# Patient Record
Sex: Male | Born: 1970 | Race: White | Hispanic: No | Marital: Married | Smoking: Never smoker
Health system: Southern US, Community
[De-identification: ages and names within clinical notes are randomized; demographics above are authoritative.]

## PROBLEM LIST (undated history)

## (undated) DIAGNOSIS — C801 Malignant (primary) neoplasm, unspecified: Secondary | ICD-10-CM

## (undated) DIAGNOSIS — I10 Essential (primary) hypertension: Secondary | ICD-10-CM

## (undated) HISTORY — DX: Essential (primary) hypertension: I10

## (undated) HISTORY — DX: Malignant (primary) neoplasm, unspecified: C80.1

---

## 2006-01-26 ENCOUNTER — Encounter: Admission: RE | Admit: 2006-01-26 | Discharge: 2006-01-26 | Payer: Self-pay | Admitting: Family Medicine

## 2006-11-16 ENCOUNTER — Ambulatory Visit: Payer: Self-pay | Admitting: Family Medicine

## 2006-12-11 ENCOUNTER — Ambulatory Visit: Payer: Self-pay | Admitting: Pulmonary Disease

## 2006-12-14 ENCOUNTER — Ambulatory Visit (HOSPITAL_BASED_OUTPATIENT_CLINIC_OR_DEPARTMENT_OTHER): Admission: RE | Admit: 2006-12-14 | Discharge: 2006-12-14 | Payer: Self-pay | Admitting: Pulmonary Disease

## 2006-12-22 ENCOUNTER — Ambulatory Visit: Payer: Self-pay | Admitting: Pulmonary Disease

## 2007-02-05 ENCOUNTER — Ambulatory Visit: Payer: Self-pay | Admitting: Pulmonary Disease

## 2007-03-08 ENCOUNTER — Ambulatory Visit: Payer: Self-pay | Admitting: Pulmonary Disease

## 2011-01-28 ENCOUNTER — Emergency Department (HOSPITAL_COMMUNITY)
Admission: EM | Admit: 2011-01-28 | Discharge: 2011-01-28 | Disposition: A | Discharge status: Other Acute Inpatient Hospital | Payer: 59 | Attending: Emergency Medicine | Admitting: Emergency Medicine

## 2011-01-28 ENCOUNTER — Emergency Department (INDEPENDENT_AMBULATORY_CARE_PROVIDER_SITE_OTHER): Payer: 59

## 2011-01-28 ENCOUNTER — Emergency Department (HOSPITAL_BASED_OUTPATIENT_CLINIC_OR_DEPARTMENT_OTHER)
Admission: EM | Admit: 2011-01-28 | Discharge: 2011-01-28 | Disposition: A | Discharge status: Other Acute Inpatient Hospital | Payer: 59 | Source: Home / Self Care | Attending: Emergency Medicine | Admitting: Emergency Medicine

## 2011-01-28 DIAGNOSIS — R079 Chest pain, unspecified: Secondary | ICD-10-CM | POA: Insufficient documentation

## 2011-01-28 DIAGNOSIS — R0602 Shortness of breath: Secondary | ICD-10-CM

## 2011-01-28 DIAGNOSIS — R110 Nausea: Secondary | ICD-10-CM | POA: Insufficient documentation

## 2011-01-28 DIAGNOSIS — R197 Diarrhea, unspecified: Secondary | ICD-10-CM | POA: Insufficient documentation

## 2011-01-28 DIAGNOSIS — R0789 Other chest pain: Secondary | ICD-10-CM

## 2011-01-28 DIAGNOSIS — R072 Precordial pain: Secondary | ICD-10-CM | POA: Insufficient documentation

## 2011-01-28 LAB — COMPREHENSIVE METABOLIC PANEL
ALT: 33 U/L (ref 0–53)
BUN: 12 mg/dL (ref 6–23)
Calcium: 9.7 mg/dL (ref 8.4–10.5)
Chloride: 106 mEq/L (ref 96–112)
Glucose, Bld: 101 mg/dL — ABNORMAL HIGH (ref 70–99)
Total Protein: 8.1 g/dL (ref 6.0–8.3)

## 2011-01-28 LAB — POCT CARDIAC MARKERS
CKMB, poc: <1 ng/mL — ABNORMAL LOW (ref 1.0–8.0)
CKMB, poc: <1 ng/mL — ABNORMAL LOW (ref 1.0–8.0)
Myoglobin, poc: 59.8 ng/mL (ref 12–200)
Troponin i, poc: <0.05 ng/mL (ref 0.00–0.09)
Troponin i, poc: <0.05 ng/mL (ref 0.00–0.09)

## 2011-01-28 LAB — CBC
Hemoglobin: 14.3 g/dL (ref 13.0–17.0)
MCH: 30.9 pg (ref 26.0–34.0)
Platelets: 204 10*3/uL (ref 150–400)
RBC: 4.63 MIL/uL (ref 4.22–5.81)
WBC: 6.2 10*3/uL (ref 4.0–10.5)

## 2011-01-28 LAB — DIFFERENTIAL
Basophils Relative: 1 % (ref 0–1)
Eosinophils Relative: 2 % (ref 0–5)
Lymphs Abs: 1.6 10*3/uL (ref 0.7–4.0)
Monocytes Absolute: 0.6 10*3/uL (ref 0.1–1.0)
Monocytes Relative: 10 % (ref 3–12)
Neutro Abs: 3.9 10*3/uL (ref 1.7–7.7)

## 2011-01-28 LAB — BASIC METABOLIC PANEL
BUN: 12 mg/dL (ref 6–23)
Creatinine, Ser: 1.2 mg/dL (ref 0.4–1.5)
GFR calc Af Amer: >60 mL/min (ref >60)
GFR calc non Af Amer: >60 mL/min (ref >60)

## 2011-01-29 DIAGNOSIS — R072 Precordial pain: Secondary | ICD-10-CM

## 2011-01-29 LAB — POCT CARDIAC MARKERS
CKMB, poc: <1 ng/mL — ABNORMAL LOW (ref 1.0–8.0)
Myoglobin, poc: 80.6 ng/mL (ref 12–200)
Myoglobin, poc: 80.1 ng/mL (ref 12–200)
Troponin i, poc: <0.05 ng/mL (ref 0.00–0.09)

## 2011-09-26 IMAGING — CR DG CHEST 2V
2 series · 2 of 2 positions shown · non-contrast
Comparison: None

CLINICAL DATA: Chest pressure.  Shortness of breath and nausea.

CHEST - 2 VIEW

[w chest pa]
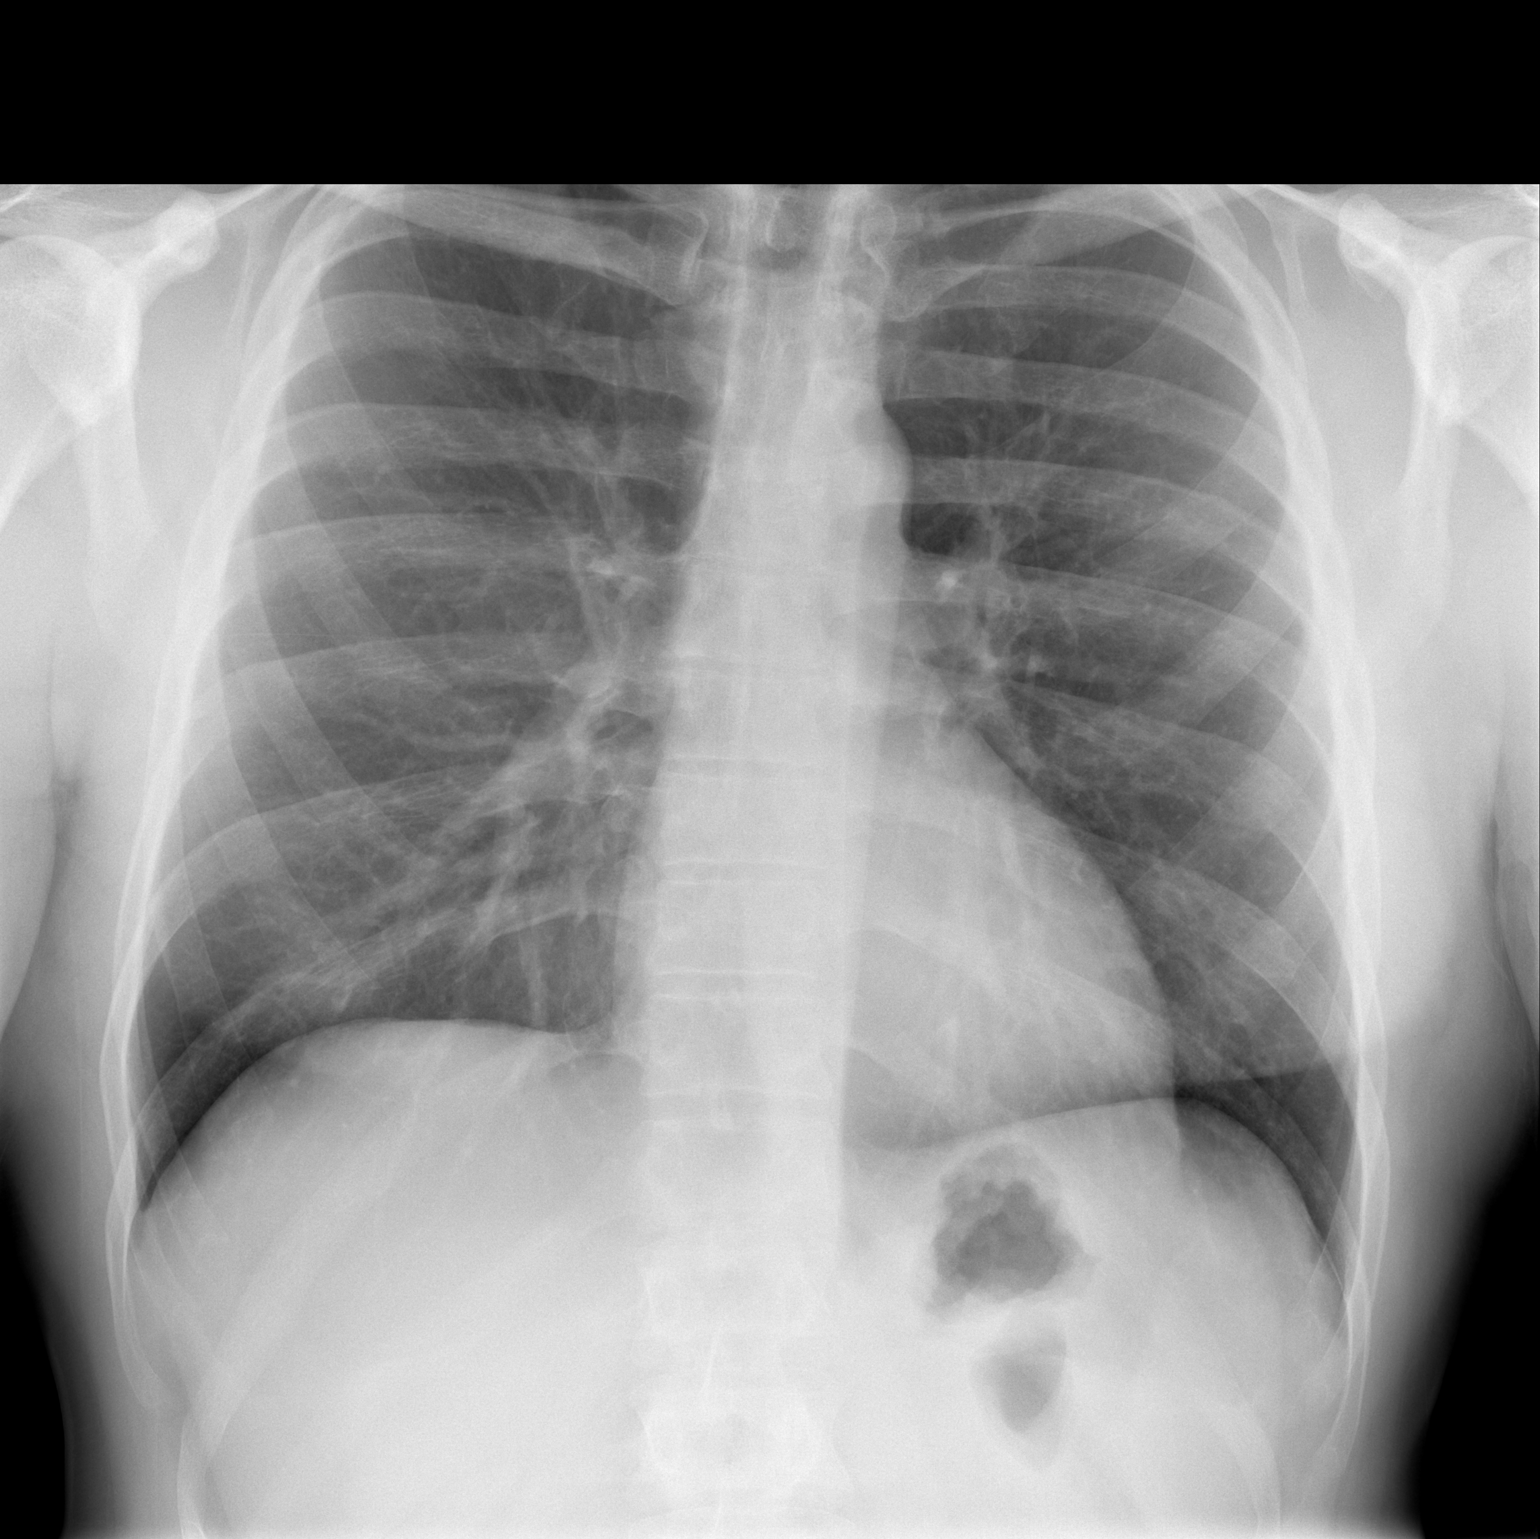

[w chest lat]
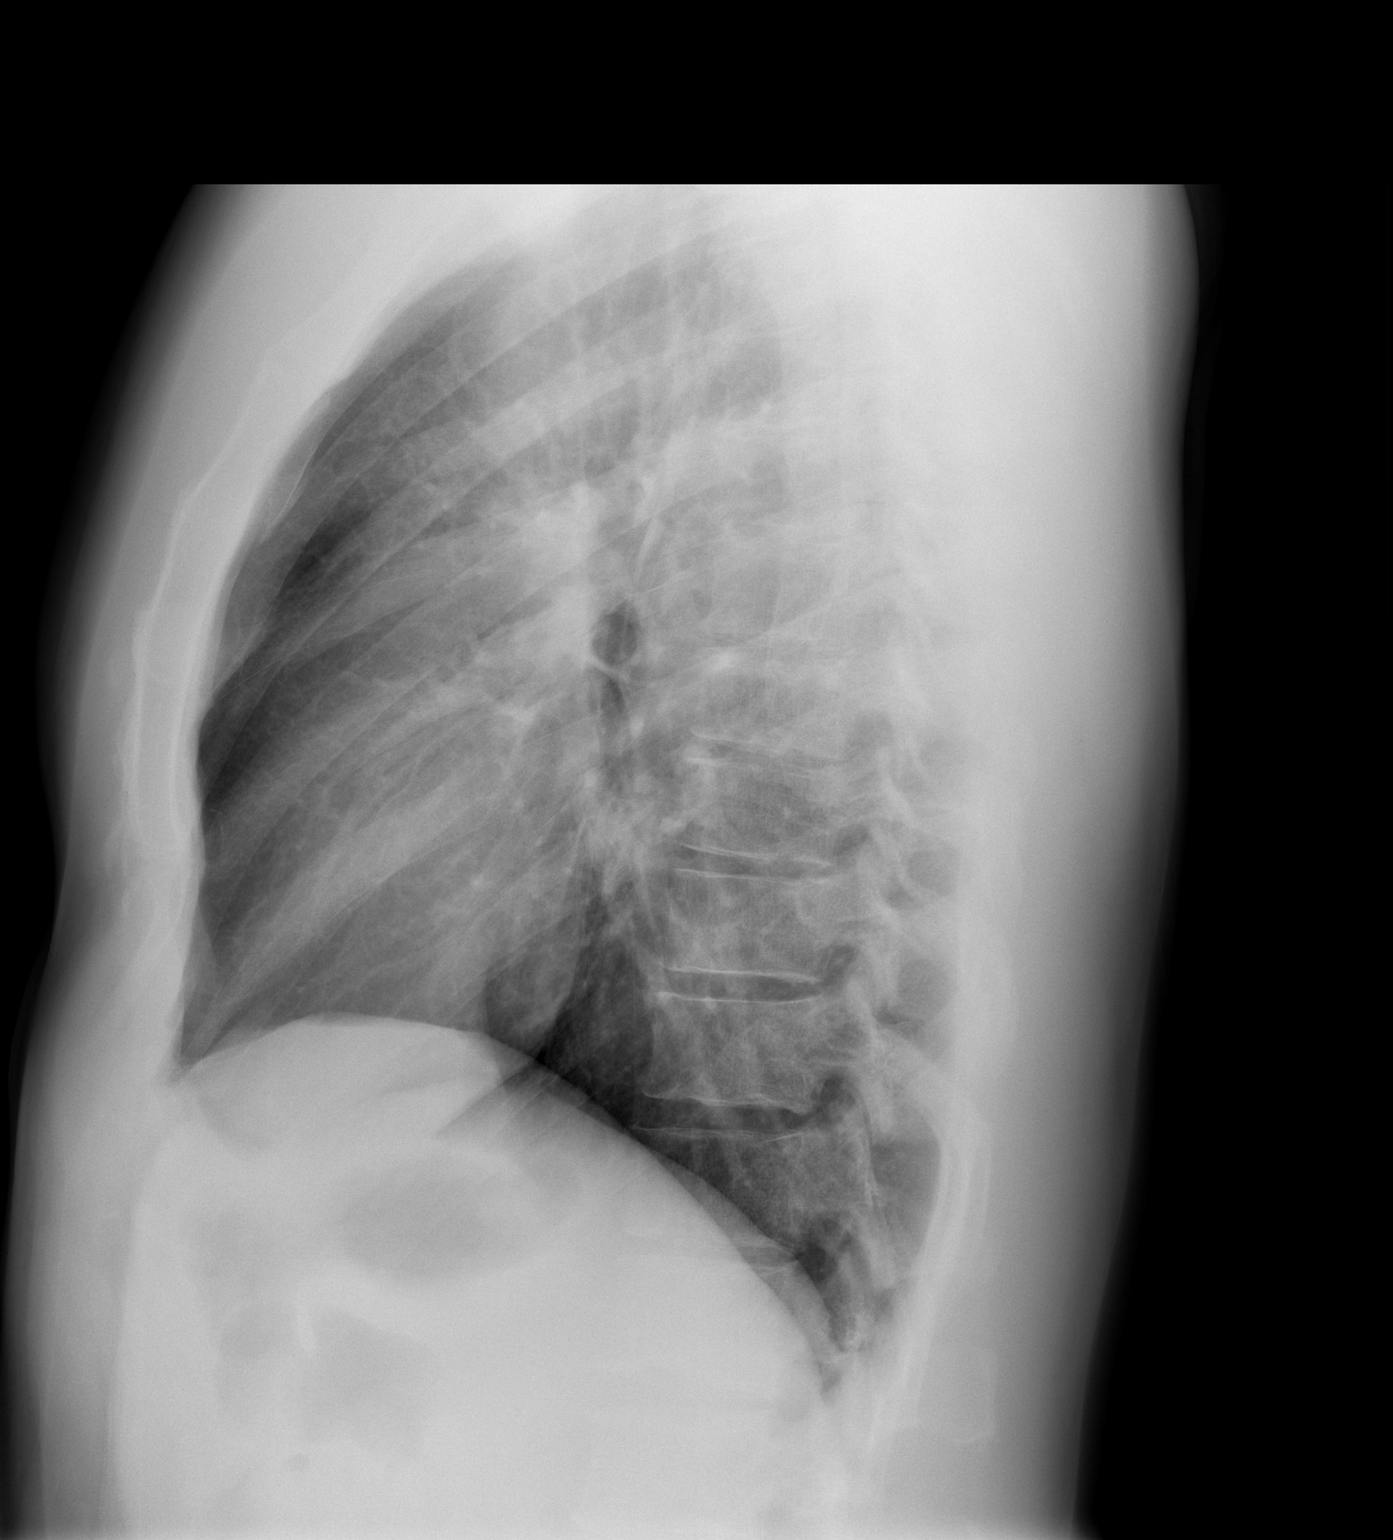

[2 of 2 positions shown; findings below may reference images not displayed]

FINDINGS: The heart size and mediastinal contours are within normal
limits.  Both lungs are clear.  The visualized skeletal structures
are unremarkable.
IMPRESSION: No active cardiopulmonary abnormalities.

## 2018-12-16 DIAGNOSIS — I10 Essential (primary) hypertension: Secondary | ICD-10-CM | POA: Diagnosis not present

## 2018-12-19 DIAGNOSIS — R531 Weakness: Secondary | ICD-10-CM | POA: Diagnosis not present

## 2018-12-19 DIAGNOSIS — I10 Essential (primary) hypertension: Secondary | ICD-10-CM | POA: Diagnosis not present

## 2018-12-19 DIAGNOSIS — R51 Headache: Secondary | ICD-10-CM | POA: Diagnosis not present

## 2018-12-19 DIAGNOSIS — R079 Chest pain, unspecified: Secondary | ICD-10-CM | POA: Diagnosis not present

## 2018-12-19 DIAGNOSIS — Z79899 Other long term (current) drug therapy: Secondary | ICD-10-CM | POA: Diagnosis not present

## 2018-12-20 ENCOUNTER — Encounter: Payer: Self-pay | Admitting: Family Medicine

## 2018-12-20 ENCOUNTER — Ambulatory Visit: Payer: 59 | Admitting: Family Medicine

## 2018-12-20 VITALS — BP 152/78 | HR 68 | Temp 98.2°F | Ht 76.0 in | Wt 229.0 lb

## 2018-12-20 DIAGNOSIS — Z23 Encounter for immunization: Secondary | ICD-10-CM

## 2018-12-20 DIAGNOSIS — I10 Essential (primary) hypertension: Secondary | ICD-10-CM | POA: Diagnosis not present

## 2018-12-20 MED ORDER — OLMESARTAN MEDOXOMIL 20 MG PO TABS: 20.0000 mg | ORAL_TABLET | Freq: Every day | ORAL | 1 refills | Status: DC

## 2018-12-20 MED ORDER — GENERIC EXTERNAL MEDICATION
10.00 | Status: DC
Start: ? — End: 2018-12-20

## 2018-12-20 MED ORDER — SODIUM CHLORIDE 0.9 % IV SOLN
10.00 | INTRAVENOUS | Status: DC
Start: ? — End: 2018-12-20

## 2018-12-20 NOTE — Patient Instructions (Signed)
Start olmesartan in place of losartan F/u with me in 4-6 weeks.     DASH Eating Plan DASH stands for "Dietary Approaches to Stop Hypertension." The DASH eating plan is a healthy eating plan that has been shown to reduce high blood pressure (hypertension). It may also reduce your risk for type 2 diabetes, heart disease, and stroke. The DASH eating plan may also help with weight loss. What are tips for following this plan?  General guidelines  Avoid eating more than 2,300 mg (milligrams) of salt (sodium) a day. If you have hypertension, you may need to reduce your sodium intake to 1,500 mg a day.  Limit alcohol intake to no more than 1 drink a day for nonpregnant women and 2 drinks a day for men. One drink equals 12 oz of beer, 5 oz of wine, or 1 oz of hard liquor.  Work with your health care provider to maintain a healthy body weight or to lose weight. Ask what an ideal weight is for you.  Get at least 30 minutes of exercise that causes your heart to beat faster (aerobic exercise) most days of the week. Activities may include walking, swimming, or biking.  Work with your health care provider or diet and nutrition specialist (dietitian) to adjust your eating plan to your individual calorie needs. Reading food labels   Check food labels for the amount of sodium per serving. Choose foods with less than 5 percent of the Daily Value of sodium. Generally, foods with less than 300 mg of sodium per serving fit into this eating plan.  To find whole grains, look for the word "whole" as the first word in the ingredient list. Shopping  Buy products labeled as "low-sodium" or "no salt added."  Buy fresh foods. Avoid canned foods and premade or frozen meals. Cooking  Avoid adding salt when cooking. Use salt-free seasonings or herbs instead of table salt or sea salt. Check with your health care provider or pharmacist before using salt substitutes.  Do not fry foods. Cook foods using healthy  methods such as baking, boiling, grilling, and broiling instead.  Cook with heart-healthy oils, such as olive, canola, soybean, or sunflower oil. Meal planning  Eat a balanced diet that includes: ? 5 or more servings of fruits and vegetables each day. At each meal, try to fill half of your plate with fruits and vegetables. ? Up to 6-8 servings of whole grains each day. ? Less than 6 oz of lean meat, poultry, or fish each day. A 3-oz serving of meat is about the same size as a deck of cards. One egg equals 1 oz. ? 2 servings of low-fat dairy each day. ? A serving of nuts, seeds, or beans 5 times each week. ? Heart-healthy fats. Healthy fats called Omega-3 fatty acids are found in foods such as flaxseeds and coldwater fish, like sardines, salmon, and mackerel.  Limit how much you eat of the following: ? Canned or prepackaged foods. ? Food that is high in trans fat, such as fried foods. ? Food that is high in saturated fat, such as fatty meat. ? Sweets, desserts, sugary drinks, and other foods with added sugar. ? Full-fat dairy products.  Do not salt foods before eating.  Try to eat at least 2 vegetarian meals each week.  Eat more home-cooked food and less restaurant, buffet, and fast food.  When eating at a restaurant, ask that your food be prepared with less salt or no salt, if possible. What foods  are recommended? The items listed may not be a complete list. Talk with your dietitian about what dietary choices are best for you. Grains Whole-grain or whole-wheat bread. Whole-grain or whole-wheat pasta. Brown rice. [place]. Bulgur. Whole-grain and low-sodium cereals. Pita bread. Low-fat, low-sodium crackers. Whole-wheat flour tortillas. Vegetables Fresh or frozen vegetables (raw, steamed, roasted, or grilled). Low-sodium or reduced-sodium tomato and vegetable juice. Low-sodium or reduced-sodium tomato sauce and tomato paste. Low-sodium or reduced-sodium canned  vegetables. Fruits All fresh, dried, or frozen fruit. Canned fruit in natural juice (without added sugar). Meat and other protein foods Skinless chicken or [place]. Ground chicken or [place]. Pork with fat trimmed off. Fish and seafood. Egg whites. Dried beans, peas, or lentils. Unsalted nuts, nut butters, and seeds. Unsalted canned beans. Lean cuts of beef with fat trimmed off. Low-sodium, lean deli meat. Dairy Low-fat (1%) or fat-free (skim) milk. Fat-free, low-fat, or reduced-fat cheeses. Nonfat, low-sodium ricotta or cottage cheese. Low-fat or nonfat yogurt. Low-fat, low-sodium cheese. Fats and oils Soft margarine without trans fats. Vegetable oil. Low-fat, reduced-fat, or light mayonnaise and salad dressings (reduced-sodium). Canola, safflower, olive, soybean, and sunflower oils. Avocado. Seasoning and other foods Herbs. Spices. Seasoning mixes without salt. Unsalted popcorn and pretzels. Fat-free sweets. What foods are not recommended? The items listed may not be a complete list. Talk with your dietitian about what dietary choices are best for you. Grains Baked goods made with fat, such as croissants, muffins, or some breads. Dry pasta or rice meal packs. Vegetables Creamed or fried vegetables. Vegetables in a cheese sauce. Regular canned vegetables (not low-sodium or reduced-sodium). Regular canned tomato sauce and paste (not low-sodium or reduced-sodium). Regular tomato and vegetable juice (not low-sodium or reduced-sodium). Angie Fava. Olives. Fruits Canned fruit in a light or heavy syrup. Fried fruit. Fruit in cream or butter sauce. Meat and other protein foods Fatty cuts of meat. Ribs. Fried meat. Berniece Salines. Sausage. Bologna and other processed lunch meats. Salami. Fatback. Hotdogs. Bratwurst. Salted nuts and seeds. Canned beans with added salt. Canned or smoked fish. Whole eggs or egg yolks. Chicken or [place] with skin. Dairy Whole or 2% milk, cream, and half-and-half. Whole or full-fat  cream cheese. Whole-fat or sweetened yogurt. Full-fat cheese. Nondairy creamers. Whipped toppings. Processed cheese and cheese spreads. Fats and oils Butter. Stick margarine. Lard. Shortening. Ghee. Bacon fat. Tropical oils, such as coconut, palm kernel, or palm oil. Seasoning and other foods Salted popcorn and pretzels. Onion salt, garlic salt, seasoned salt, table salt, and sea salt. Worcestershire sauce. Tartar sauce. Barbecue sauce. Teriyaki sauce. Soy sauce, including reduced-sodium. Steak sauce. Canned and packaged gravies. Fish sauce. Oyster sauce. Cocktail sauce. Horseradish that you find on the shelf. Ketchup. Mustard. Meat flavorings and tenderizers. Bouillon cubes. Hot sauce and Tabasco sauce. Premade or packaged marinades. Premade or packaged taco seasonings. Relishes. Regular salad dressings. Where to find more information:  National Heart, Lung, and [place]: https://wilson-eaton.com/  American Heart Association: www.heart.org Summary  The DASH eating plan is a healthy eating plan that has been shown to reduce high blood pressure (hypertension). It may also reduce your risk for type 2 diabetes, heart disease, and stroke.  With the DASH eating plan, you should limit salt (sodium) intake to 2,300 mg a day. If you have hypertension, you may need to reduce your sodium intake to 1,500 mg a day.  When on the DASH eating plan, aim to eat more fresh fruits and vegetables, whole grains, lean proteins, low-fat dairy, and heart-healthy fats.  Work with your  health care provider or diet and nutrition specialist (dietitian) to adjust your eating plan to your individual calorie needs. This information is not intended to replace advice given to you by your health care provider. Make sure you discuss any questions you have with your health care provider. Document Released: 11/13/2011 Document Revised: 11/17/2016 Document Reviewed: 11/17/2016 Elsevier Interactive Patient Education  2019 [employer].

## 2018-12-20 NOTE — Progress Notes (Signed)
David Avila - 48 y.o. male MRN [id number]  Date of birth: 12/23/1970  Subjective Chief Complaint  Patient presents with  . Hypertension    HPI David Avila is a 48 y.o. male here today to establish care with new pcp.  He was recently diagnosed with HTN after reporting not feeling well and having frequent headache.  He was started on losaratan.  He continued to not feel well into this weekend and was seen at a local ER.  Given hydralazine and metoprolol with improvement of BP and symptoms.  He has continued on losartan since that time.  His bp remains elevated today but he reports that he is feeling ok.  He feels that he follows a healthy diet and exercises fairly regularly.  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  Currently he denies chest pain,. shortness of breath, palpitations, headache or vision changes.   ROS:  A comprehensive ROS was completed and negative except as noted per HPI  No Known Allergies  Past Medical History:  Diagnosis Date  . Cancer ([place])    melanoma -insitu-left foot    History reviewed. No pertinent surgical history.  Social History   Socioeconomic History  . Marital status: Single    Spouse name: Not on file  . Number of children: Not on file  . Years of education: Not on file  . Highest education level: Not on file  Occupational History  . Not on file  Social Needs  . Financial resource strain: Not on file  . Food insecurity:    Worry: Not on file    Inability: Not on file  . Transportation needs:    Medical: Not on file    Non-medical: Not on file  Tobacco Use  . Smoking status: Never Smoker  . Smokeless tobacco: Never Used  Substance and Sexual Activity  . Alcohol use: Yes  . Drug use: Not on file  . Sexual activity: Not on file  Lifestyle  . Physical activity:    Days per week: Not on file    Minutes per session: Not on file  . Stress: Not on file  Relationships  . Social connections:   Talks on phone: Not on file    Gets together: Not on file    Attends religious service: Not on file    Active member of club or organization: Not on file    Attends meetings of clubs or organizations: Not on file    Relationship status: Not on file  Other Topics Concern  . Not on file  Social History Narrative  . Not on file    Family History  Problem Relation Age of Onset  . COPD Mother   . Diabetes Mother   . Early death Mother   . Hyperlipidemia Mother   . Hypertension Mother   . Stroke Mother   . Alcohol abuse Father   . Cancer Father   . Early death Father   . COPD Maternal Grandmother   . Early death Maternal Grandmother   . Early death Maternal Grandfather   . Hyperlipidemia Maternal Grandfather   . Cancer Paternal Grandmother   . Heart disease Paternal Grandfather     Health Maintenance  Topic Date Due  . HIV Screening  08/03/1984  . TETANUS/TDAP  08/03/1988  . INFLUENZA VACCINE  07/08/2018    ----------------------------------------------------------------------------------------------------------------------------------------------------------------------------------------------------------------- Physical Exam BP (!) 152/78   Pulse 68   Temp 98.2 F (36.8 C) (Oral)  Ht 6\' 4"  (1.93 m)   Wt 229 lb (103.9 kg)   SpO2 98%   BMI 27.87 kg/m   Physical Exam Constitutional:      Appearance: Normal appearance.  HENT:     Head: Normocephalic.     Mouth/Throat:     Mouth: Mucous membranes are moist.  Eyes:     General: No scleral icterus. Neck:     Musculoskeletal: Neck supple.  Cardiovascular:     Rate and Rhythm: Normal rate and regular rhythm.  Pulmonary:     Effort: Pulmonary effort is normal.     Breath sounds: Normal breath sounds.  Musculoskeletal:     Right lower leg: No edema.     Left lower leg: No edema.  Lymphadenopathy:     Cervical: No cervical adenopathy.  Skin:    General: Skin is warm and dry.     Findings: No rash.    Neurological:     General: No focal deficit present.     Mental Status: He is alert.  Psychiatric:        Mood and Affect: Mood normal.        Behavior: Behavior normal.     ------------------------------------------------------------------------------------------------------------------------------------------------------------------------------------------------------------------- Assessment and Plan  Essential hypertension -BP remains elevated, asymptomatic at this time.  -Change losartan to olmesartan for longer half life.  -Lab work from ED reviewed via [employer].  -Given info on DASH diet -F/u in 4-6 weeks.

## 2018-12-20 NOTE — Assessment & Plan Note (Signed)
-  BP remains elevated, asymptomatic at this time.  -Change losartan to olmesartan for longer half life.  -Lab work from ED reviewed via [employer].  -Given info on DASH diet -F/u in 4-6 weeks.

## 2019-01-31 ENCOUNTER — Ambulatory Visit (INDEPENDENT_AMBULATORY_CARE_PROVIDER_SITE_OTHER): Payer: 59 | Admitting: Family Medicine

## 2019-01-31 ENCOUNTER — Encounter: Payer: Self-pay | Admitting: Family Medicine

## 2019-01-31 VITALS — BP 122/78 | HR 62 | Temp 98.2°F | Ht 76.0 in | Wt 232.0 lb

## 2019-01-31 DIAGNOSIS — Z0000 Encounter for general adult medical examination without abnormal findings: Secondary | ICD-10-CM

## 2019-01-31 DIAGNOSIS — Z13220 Encounter for screening for lipoid disorders: Secondary | ICD-10-CM

## 2019-01-31 DIAGNOSIS — I10 Essential (primary) hypertension: Secondary | ICD-10-CM

## 2019-01-31 DIAGNOSIS — Z125 Encounter for screening for malignant neoplasm of prostate: Secondary | ICD-10-CM | POA: Diagnosis not present

## 2019-01-31 LAB — COMPREHENSIVE METABOLIC PANEL
ALT: 14 U/L (ref 0–53)
AST: 19 U/L (ref 0–37)
Albumin: 4.7 g/dL (ref 3.5–5.2)
Alkaline Phosphatase: 63 U/L (ref 39–117)
BUN: 13 mg/dL (ref 6–23)
CALCIUM: 9.6 mg/dL (ref 8.4–10.5)
CHLORIDE: 98 meq/L (ref 96–112)
CO2: 31 meq/L (ref 19–32)
Creatinine, Ser: 1.14 mg/dL (ref 0.40–1.50)
GFR: 68.14 mL/min (ref >60.00)
GLUCOSE: 92 mg/dL (ref 70–99)
Potassium: 4.8 mEq/L (ref 3.5–5.1)
SODIUM: 135 meq/L (ref 135–145)
Total Bilirubin: 0.7 mg/dL (ref 0.2–1.2)
Total Protein: 7.2 g/dL (ref 6.0–8.3)

## 2019-01-31 LAB — LIPID PANEL
CHOL/HDL RATIO: 2
CHOLESTEROL: 162 mg/dL (ref 0–200)
HDL: 69.2 mg/dL (ref >39.00)
LDL CALC: 72 mg/dL (ref 0–99)
NonHDL: 92.82
Triglycerides: 104 mg/dL (ref 0.0–149.0)
VLDL: 20.8 mg/dL (ref 0.0–40.0)

## 2019-01-31 LAB — CBC
HEMATOCRIT: 40.1 % (ref 39.0–52.0)
Hemoglobin: 14.4 g/dL (ref 13.0–17.0)
MCHC: 35.9 g/dL (ref 30.0–36.0)
MCV: 89.1 fl (ref 78.0–100.0)
Platelets: 244 10*3/uL (ref 150.0–400.0)
RBC: 4.5 Mil/uL (ref 4.22–5.81)
RDW: 13.5 % (ref 11.5–15.5)
WBC: 6.2 10*3/uL (ref 4.0–10.5)

## 2019-01-31 LAB — PSA: PSA: 1.05 ng/mL (ref 0.10–4.00)

## 2019-01-31 NOTE — Assessment & Plan Note (Signed)
Well adult Orders Placed This Encounter  Procedures  . Comp Met (CMET)  . CBC  . Lipid Profile  . PSA  Screenings: Lipid, PSA.  Due for colon cancer screening next year.  Immunizations:  Up to date  Anticipatory guidance/Risk Factor reduction:  Per AVS

## 2019-01-31 NOTE — Patient Instructions (Signed)

## 2019-01-31 NOTE — Assessment & Plan Note (Signed)
-  BP well controlled, he'll continue olmesartan Return in about 6 months (around 08/01/2019) for HTN.

## 2019-01-31 NOTE — Progress Notes (Signed)
David Avila - 48 y.o. male MRN [id number]  Date of birth: 12/24/1970  Subjective Chief Complaint  Patient presents with  . Hypertension    doing well-denies changes     HPI David Avila is a 48 y.o. male with history of HTN here today for annual exam.  BP is much better controlled today with switch to olmesartan.  Reports he is doing well with current medication.  Denies side effects.  Has no additional concerns today.  He is a non-smoker with occasional EtOH use.  He exercises regularly and follows a fairly healthy diet.   Review of Systems  Constitutional: Negative for chills, fever, malaise/fatigue and weight loss.  HENT: Negative for congestion, ear pain and sore throat.   Eyes: Negative for blurred vision, double vision and pain.  Respiratory: Negative for cough and shortness of breath.   Cardiovascular: Negative for chest pain and palpitations.  Gastrointestinal: Negative for abdominal pain, blood in stool, constipation, heartburn and nausea.  Genitourinary: Negative for dysuria and urgency.  Musculoskeletal: Negative for joint pain and myalgias.  Neurological: Negative for dizziness and headaches.  Endo/Heme/Allergies: Does not bruise/bleed easily.  Psychiatric/Behavioral: Negative for depression. The patient is not nervous/anxious and does not have insomnia.      No Known Allergies  Past Medical History:  Diagnosis Date  . Cancer ([place])    melanoma -insitu-left foot    History reviewed. No pertinent surgical history.  Social History   Socioeconomic History  . Marital status: Single    Spouse name: Not on file  . Number of children: Not on file  . Years of education: Not on file  . Highest education level: Not on file  Occupational History  . Not on file  Social Needs  . Financial resource strain: Not on file  . Food insecurity:    Worry: Not on file    Inability: Not on file  . Transportation needs:    Medical: Not on file    Non-medical: Not on  file  Tobacco Use  . Smoking status: Never Smoker  . Smokeless tobacco: Never Used  Substance and Sexual Activity  . Alcohol use: Yes  . Drug use: Never  . Sexual activity: Yes  Lifestyle  . Physical activity:    Days per week: Not on file    Minutes per session: Not on file  . Stress: Not on file  Relationships  . Social connections:    Talks on phone: Not on file    Gets together: Not on file    Attends religious service: Not on file    Active member of club or organization: Not on file    Attends meetings of clubs or organizations: Not on file    Relationship status: Not on file  Other Topics Concern  . Not on file  Social History Narrative  . Not on file    Family History  Problem Relation Age of Onset  . COPD Mother   . Diabetes Mother   . Early death Mother   . Hyperlipidemia Mother   . Hypertension Mother   . Stroke Mother   . Alcohol abuse Father   . Cancer Father   . Early death Father   . COPD Maternal Grandmother   . Early death Maternal Grandmother   . Early death Maternal Grandfather   . Hyperlipidemia Maternal Grandfather   . Cancer Paternal Grandmother   . Heart disease Paternal Grandfather     Health Maintenance  Topic Date Due  .  HIV Screening  08/03/1984  . TETANUS/TDAP  08/03/1988  . INFLUENZA VACCINE  Completed    ----------------------------------------------------------------------------------------------------------------------------------------------------------------------------------------------------------------- Physical Exam BP 122/78   Pulse 62   Temp 98.2 F (36.8 C) (Oral)   Ht _0  (1.93 m)   Wt 232 lb (105.2 kg)   SpO2 98%   BMI 28.24 kg/m   Physical Exam Constitutional:      General: He is not in acute distress. HENT:     Head: Normocephalic and atraumatic.     Right Ear: External ear normal.     Left Ear: External ear normal.     Mouth/Throat:     Mouth: Mucous membranes are moist.  Eyes:     General: No  scleral icterus. Neck:     Musculoskeletal: Normal range of motion.     Thyroid: No thyromegaly.  Cardiovascular:     Rate and Rhythm: Normal rate and regular rhythm.     Heart sounds: Normal heart sounds.  Pulmonary:     Effort: Pulmonary effort is normal.     Breath sounds: Normal breath sounds.  Abdominal:     General: Bowel sounds are normal. There is no distension.     Palpations: Abdomen is soft.     Tenderness: There is no abdominal tenderness. There is no guarding.  Lymphadenopathy:     Cervical: No cervical adenopathy.  Skin:    General: Skin is warm and dry.     Findings: No rash.  Neurological:     Mental Status: He is alert and oriented to person, place, and time.     Cranial Nerves: No cranial nerve deficit.     Motor: No abnormal muscle tone.  Psychiatric:        Mood and Affect: Mood normal.        Behavior: Behavior normal.     ------------------------------------------------------------------------------------------------------------------------------------------------------------------------------------------------------------------- Assessment and Plan  Essential hypertension -BP well controlled, he'll continue olmesartan Return in about 6 months (around 08/01/2019) for HTN.   Well adult exam Well adult Orders Placed This Encounter  Procedures  . Comp Met (CMET)  . CBC  . Lipid Profile  . PSA  Screenings: Lipid, PSA.  Due for colon cancer screening next year.  Immunizations:  Up to date  Anticipatory guidance/Risk Factor reduction:  Per AVS

## 2019-07-05 ENCOUNTER — Other Ambulatory Visit: Payer: Self-pay | Admitting: Family Medicine

## 2019-08-01 ENCOUNTER — Ambulatory Visit: Payer: 59 | Admitting: Family Medicine

## 2019-08-01 ENCOUNTER — Encounter: Payer: Self-pay | Admitting: Family Medicine

## 2019-08-01 VITALS — BP 118/76 | HR 62 | Temp 98.1°F | Ht 76.0 in | Wt 233.2 lb

## 2019-08-01 DIAGNOSIS — I10 Essential (primary) hypertension: Secondary | ICD-10-CM | POA: Diagnosis not present

## 2019-08-01 DIAGNOSIS — N529 Male erectile dysfunction, unspecified: Secondary | ICD-10-CM | POA: Diagnosis not present

## 2019-08-01 DIAGNOSIS — Z23 Encounter for immunization: Secondary | ICD-10-CM | POA: Diagnosis not present

## 2019-08-01 DIAGNOSIS — Z1211 Encounter for screening for malignant neoplasm of colon: Secondary | ICD-10-CM | POA: Diagnosis not present

## 2019-08-01 NOTE — Patient Instructions (Signed)

## 2019-08-01 NOTE — Assessment & Plan Note (Signed)
-  BP remains well controlled with current dose of olmesartan, will continue.

## 2019-08-01 NOTE — Assessment & Plan Note (Signed)
-  Elects to have cologuard.  Discussed limitations of this test and recommendations for colonoscopy if test is positive.

## 2019-08-01 NOTE — Assessment & Plan Note (Addendum)
-  New problem -Check testosterone levels today.

## 2019-08-01 NOTE — Progress Notes (Signed)
David Avila - 48 y.o. male MRN [id number]  Date of birth: [birth year]  Subjective Chief Complaint  Patient presents with  . Hypertension    HPI David Avila is a 48 y.o. male with history of HTN and melanoma here today for follow up of HTN.  His blood pressure is currently treated with olmesartan.   He reports that he is doing well with current medication and denies significant side effects related to this.  He does follow a low salt diet and exercises pretty regularly.   He does report some issues with ED recently.  Difficulty with maintaining full erection.  Also feels like strength and muscle mass is decreased despite exercising regularly.  He would like to have testosterone levels checked.    He will be 48 in a few days.  He has never had colon cancer screening.  Denies any first degree relatives with history of colon cancer.    ROS:  A comprehensive ROS was completed and negative except as noted per HPI    eNo Known Allergies  Past Medical History:  Diagnosis Date  . Cancer ([place])    melanoma -insitu-left foot    History reviewed. No pertinent surgical history.  Social History   Socioeconomic History  . Marital status: Single    Spouse name: Not on file  . Number of children: Not on file  . Years of education: Not on file  . Highest education level: Not on file  Occupational History  . Not on file  Social Needs  . Financial resource strain: Not on file  . Food insecurity    Worry: Not on file    Inability: Not on file  . Transportation needs    Medical: Not on file    Non-medical: Not on file  Tobacco Use  . Smoking status: Never Smoker  . Smokeless tobacco: Never Used  Substance and Sexual Activity  . Alcohol use: Yes  . Drug use: Never  . Sexual activity: Yes  Lifestyle  . Physical activity    Days per week: Not on file    Minutes per session: Not on file  . Stress: Not on file  Relationships  . Social Herbalist on phone: Not on file     Gets together: Not on file    Attends religious service: Not on file    Active member of club or organization: Not on file    Attends meetings of clubs or organizations: Not on file    Relationship status: Not on file  Other Topics Concern  . Not on file  Social History Narrative  . Not on file    Family History  Problem Relation Age of Onset  . COPD Mother   . Diabetes Mother   . Early death Mother   . Hyperlipidemia Mother   . Hypertension Mother   . Stroke Mother   . Alcohol abuse Father   . Cancer Father   . Early death Father   . COPD Maternal Grandmother   . Early death Maternal Grandmother   . Early death Maternal Grandfather   . Hyperlipidemia Maternal Grandfather   . Cancer Paternal Grandmother   . Heart disease Paternal Grandfather     Health Maintenance  Topic Date Due  . HIV Screening  08/03/1984  . TETANUS/TDAP  08/03/1988  . INFLUENZA VACCINE  07/09/2019    ----------------------------------------------------------------------------------------------------------------------------------------------------------------------------------------------------------------- Physical Exam BP 118/76 (BP Location: Left Arm, Patient Position: Sitting)   Pulse 62  Temp 98.1 F (36.7 C) (Oral)   Ht 6\' 4"  (1.93 m)   Wt 233 lb 3.2 oz (105.8 kg)   SpO2 97%   BMI 28.39 kg/m   Physical Exam Constitutional:      Appearance: Normal appearance.  HENT:     Head: Normocephalic and atraumatic.     Mouth/Throat:     Mouth: Mucous membranes are moist.  Eyes:     General: No scleral icterus. Neck:     Musculoskeletal: Neck supple.  Cardiovascular:     Rate and Rhythm: Normal rate and regular rhythm.  Pulmonary:     Effort: Pulmonary effort is normal.     Breath sounds: Normal breath sounds.  Skin:    General: Skin is warm and dry.     Findings: No rash.  Neurological:     General: No focal deficit present.     Mental Status: He is alert.  Psychiatric:         Mood and Affect: Mood normal.        Behavior: Behavior normal.     ------------------------------------------------------------------------------------------------------------------------------------------------------------------------------------------------------------------- Assessment and Plan  Essential hypertension -BP remains well controlled with current dose of olmesartan, will continue.    Erectile dysfunction -New problem -Check testosterone levels today.   Colon cancer screening -Elects to have cologuard.  Discussed limitations of this test and recommendations for colonoscopy if test is positive.    Orders Placed This Encounter  Procedures  . Flu Vaccine QUAD 6+ mos PF IM (Fluarix Quad PF)  . Testosterone     Return in about 6 months (around 02/01/2020) for CPE and f/u HTN.

## 2019-08-02 LAB — TESTOSTERONE: Testosterone: 455.49 ng/dL (ref 300.00–890.00)

## 2019-11-01 ENCOUNTER — Encounter: Payer: Self-pay | Admitting: Family Medicine

## 2019-11-02 ENCOUNTER — Other Ambulatory Visit: Payer: Self-pay

## 2019-11-02 MED ORDER — OLMESARTAN MEDOXOMIL 20 MG PO TABS: ORAL_TABLET | ORAL | 1 refills | Status: DC

## 2020-02-01 ENCOUNTER — Other Ambulatory Visit: Payer: Self-pay

## 2020-02-01 ENCOUNTER — Encounter: Payer: 59 | Admitting: Family Medicine

## 2020-02-01 ENCOUNTER — Encounter: Payer: Self-pay | Admitting: Family Medicine

## 2020-02-01 ENCOUNTER — Encounter: Payer: Self-pay | Admitting: Gastroenterology

## 2020-02-01 ENCOUNTER — Ambulatory Visit (INDEPENDENT_AMBULATORY_CARE_PROVIDER_SITE_OTHER): Payer: 59 | Admitting: Family Medicine

## 2020-02-01 VITALS — BP 120/82 | HR 61 | Temp 97.7°F | Ht 76.0 in | Wt 239.0 lb

## 2020-02-01 DIAGNOSIS — Z13220 Encounter for screening for lipoid disorders: Secondary | ICD-10-CM

## 2020-02-01 DIAGNOSIS — Z1211 Encounter for screening for malignant neoplasm of colon: Secondary | ICD-10-CM

## 2020-02-01 DIAGNOSIS — I10 Essential (primary) hypertension: Secondary | ICD-10-CM

## 2020-02-01 DIAGNOSIS — N529 Male erectile dysfunction, unspecified: Secondary | ICD-10-CM

## 2020-02-01 DIAGNOSIS — Z125 Encounter for screening for malignant neoplasm of prostate: Secondary | ICD-10-CM

## 2020-02-01 DIAGNOSIS — Z0000 Encounter for general adult medical examination without abnormal findings: Secondary | ICD-10-CM

## 2020-02-01 MED ORDER — OLMESARTAN MEDOXOMIL 20 MG PO TABS
ORAL_TABLET | ORAL | 3 refills | Status: DC
Start: 2020-02-01 — End: 2020-02-09

## 2020-02-01 NOTE — Assessment & Plan Note (Signed)
Well adult Orders Placed This Encounter  Procedures  . COMPLETE METABOLIC PANEL WITH GFR  . CBC  . Lipid Profile  . TSH  . PSA  . Ambulatory referral to Gastroenterology    Referral Priority:   Routine    Referral Type:   Consultation    Referral Reason:   Specialty Services Required    Number of Visits Requested:   1  Screening: Colon cancer screening, PSA, lipid Immunizations: UTD Anticipatory guidance/Risk factor reduction:  Continue healthy habits including regular exercise and healthy diet.  Additional recommendations per AVS.

## 2020-02-01 NOTE — Assessment & Plan Note (Signed)
BP remains well controlled with current dose of benicar, continue.

## 2020-02-01 NOTE — Progress Notes (Signed)
David Avila - 49 y.o. male MRN [id number]  Date of birth: 11-Nov-1971  Subjective Chief Complaint  Patient presents with  . Annual Exam    HPI David Avila is a 49 y.o. male with history of HTN and melanoma here today for annual exam.  He reports that he is doing well.  BP has remained well controlled with benicar.  He sees dermatology twice per year for history of melanoma.  He has no new concerns today.    He is due for colon cancer screening.  Discussed cologuard vs colonoscopy.   He does crossfit several days per week for exercise.   He is a lifelong non-smoker and consumes EtOH occasionally.    Review of Systems  Constitutional: Negative for chills, fever, malaise/fatigue and weight loss.  HENT: Negative for congestion, ear pain and sore throat.   Eyes: Negative for blurred vision, double vision and pain.  Respiratory: Negative for cough and shortness of breath.   Cardiovascular: Negative for chest pain and palpitations.  Gastrointestinal: Negative for abdominal pain, blood in stool, constipation, heartburn and nausea.  Genitourinary: Negative for dysuria and urgency.  Musculoskeletal: Negative for joint pain and myalgias.  Neurological: Negative for dizziness and headaches.  Endo/Heme/Allergies: Does not bruise/bleed easily.  Psychiatric/Behavioral: Negative for depression. The patient is not nervous/anxious and does not have insomnia.     No Known Allergies  Past Medical History:  Diagnosis Date  . Cancer ([place])    melanoma -insitu-left foot    No past surgical history on file.  Social History   Socioeconomic History  . Marital status: Married    Spouse name: Not on file  . Number of children: Not on file  . Years of education: Not on file  . Highest education level: Not on file  Occupational History  . Not on file  Tobacco Use  . Smoking status: Never Smoker  . Smokeless tobacco: Never Used  Substance and Sexual Activity  . Alcohol use: Yes  . Drug  use: Never  . Sexual activity: Yes  Other Topics Concern  . Not on file  Social History Narrative  . Not on file   Social Determinants of Health   Financial Resource Strain:   . Difficulty of Paying Living Expenses: Not on file  Food Insecurity:   . Worried About Charity fundraiser in the Last Year: Not on file  . Ran Out of Food in the Last Year: Not on file  Transportation Needs:   . Lack of Transportation (Medical): Not on file  . Lack of Transportation (Non-Medical): Not on file  Physical Activity:   . Days of Exercise per Week: Not on file  . Minutes of Exercise per Session: Not on file  Stress:   . Feeling of Stress : Not on file  Social Connections:   . Frequency of Communication with Friends and Family: Not on file  . Frequency of Social Gatherings with Friends and Family: Not on file  . Attends Religious Services: Not on file  . Active Member of Clubs or Organizations: Not on file  . Attends Archivist Meetings: Not on file  . Marital Status: Not on file    Family History  Problem Relation Age of Onset  . COPD Mother   . Diabetes Mother   . Early death Mother   . Hyperlipidemia Mother   . Hypertension Mother   . Stroke Mother   . Alcohol abuse Father   . Cancer Father   .  Early death Father   . COPD Maternal Grandmother   . Early death Maternal Grandmother   . Early death Maternal Grandfather   . Hyperlipidemia Maternal Grandfather   . Cancer Paternal Grandmother   . Heart disease Paternal Grandfather     Health Maintenance  Topic Date Due  . HIV Screening  08/03/1984  . COLONOSCOPY  08/04/2019  . TETANUS/TDAP  01/31/2021 (Originally 08/03/1988)  . INFLUENZA VACCINE  Completed     ----------------------------------------------------------------------------------------------------------------------------------------------------------------------------------------------------------------- Physical Exam BP 120/82   Pulse 61   Temp 97.7 F  (36.5 C) (Oral)   Ht 6\' 4"  (1.93 m)   Wt 239 lb (108.4 kg)   BMI 29.09 kg/m   Physical Exam Constitutional:      General: He is not in acute distress. HENT:     Head: Normocephalic and atraumatic.     Right Ear: External ear normal.     Left Ear: External ear normal.     Mouth/Throat:     Mouth: Mucous membranes are moist.  Eyes:     General: No scleral icterus. Neck:     Thyroid: No thyromegaly.  Cardiovascular:     Rate and Rhythm: Normal rate and regular rhythm.     Heart sounds: Normal heart sounds.  Pulmonary:     Effort: Pulmonary effort is normal.     Breath sounds: Normal breath sounds.  Abdominal:     General: Bowel sounds are normal. There is no distension.     Palpations: Abdomen is soft.     Tenderness: There is no abdominal tenderness. There is no guarding.  Musculoskeletal:     Cervical back: Normal range of motion.  Lymphadenopathy:     Cervical: No cervical adenopathy.  Skin:    General: Skin is warm and dry.     Findings: No rash.  Neurological:     Mental Status: He is alert and oriented to person, place, and time.     Cranial Nerves: No cranial nerve deficit.     Motor: No abnormal muscle tone.  Psychiatric:        Mood and Affect: Mood normal.        Behavior: Behavior normal.     ------------------------------------------------------------------------------------------------------------------------------------------------------------------------------------------------------------------- Assessment and Plan  Essential hypertension BP remains well controlled with current dose of benicar, continue.   Colon cancer screening Cologuard ordered previously but never completed.  He prefer to have colonoscopy at this time.  Referral entered.   Well adult exam Well adult Orders Placed This Encounter  Procedures  . COMPLETE METABOLIC PANEL WITH GFR  . CBC  . Lipid Profile  . TSH  . PSA  . Ambulatory referral to Gastroenterology    Referral  Priority:   Routine    Referral Type:   Consultation    Referral Reason:   Specialty Services Required    Number of Visits Requested:   1  Screening: Colon cancer screening, PSA, lipid Immunizations: UTD Anticipatory guidance/Risk factor reduction:  Continue healthy habits including regular exercise and healthy diet.  Additional recommendations per AVS.    Meds ordered this encounter  Medications  . olmesartan (BENICAR) 20 MG tablet    Sig: TAKE 1 TABLET(20 MG) BY MOUTH DAILY    Dispense:  90 tablet    Refill:  3    Return in about 1 year (around 01/31/2021) for annual exam.    This visit occurred during the SARS-CoV-2 public health emergency.  Safety protocols were in place, including screening questions prior to the visit,  additional usage of staff PPE, and extensive cleaning of exam room while observing appropriate contact time as indicated for disinfecting solutions.

## 2020-02-01 NOTE — Assessment & Plan Note (Signed)
Cologuard ordered previously but never completed.  He prefer to have colonoscopy at this time.  Referral entered.

## 2020-02-01 NOTE — Patient Instructions (Signed)
Preventive Care 41-51 Years Old, Male Preventive care refers to lifestyle choices and visits with your health care provider that can promote health and wellness. This includes:  A yearly physical exam. This is also called an annual well check.  Regular dental and eye exams.  Immunizations.  Screening for certain conditions.  Healthy lifestyle choices, such as eating a healthy diet, getting regular exercise, not using drugs or products that contain nicotine and tobacco, and limiting alcohol use. What can I expect for my preventive care visit? Physical exam Your health care provider will check:  Height and weight. These may be used to calculate body mass index (BMI), which is a measurement that tells if you are at a healthy weight.  Heart rate and blood pressure.  Your skin for abnormal spots. Counseling Your health care provider may ask you questions about:  Alcohol, tobacco, and drug use.  Emotional well-being.  Home and relationship well-being.  Sexual activity.  Eating habits.  Work and work Statistician. What immunizations do I need?  Influenza (flu) vaccine  This is recommended every year. Tetanus, diphtheria, and pertussis (Tdap) vaccine  You may need a Td booster every 10 years. Varicella (chickenpox) vaccine  You may need this vaccine if you have not already been vaccinated. Zoster (shingles) vaccine  You may need this after age 64. Measles, mumps, and rubella (MMR) vaccine  You may need at least one dose of MMR if you were born in 1957 or later. You may also need a second dose. Pneumococcal conjugate (PCV13) vaccine  You may need this if you have certain conditions and were not previously vaccinated. Pneumococcal polysaccharide (PPSV23) vaccine  You may need one or two doses if you smoke cigarettes or if you have certain conditions. Meningococcal conjugate (MenACWY) vaccine  You may need this if you have certain conditions. Hepatitis A  vaccine  You may need this if you have certain conditions or if you travel or work in places where you may be exposed to hepatitis A. Hepatitis B vaccine  You may need this if you have certain conditions or if you travel or work in places where you may be exposed to hepatitis B. Haemophilus influenzae type b (Hib) vaccine  You may need this if you have certain risk factors. Human papillomavirus (HPV) vaccine  If recommended by your health care provider, you may need three doses over 6 months. You may receive vaccines as individual doses or as more than one vaccine together in one shot (combination vaccines). Talk with your health care provider about the risks and benefits of combination vaccines. What tests do I need? Blood tests  Lipid and cholesterol levels. These may be checked every 5 years, or more frequently if you are over 60 years old.  Hepatitis C test.  Hepatitis B test. Screening  Lung cancer screening. You may have this screening every year starting at age 43 if you have a 30-pack-year history of smoking and currently smoke or have quit within the past 15 years.  Prostate cancer screening. Recommendations will vary depending on your family history and other risks.  Colorectal cancer screening. All adults should have this screening starting at age 72 and continuing until age 2. Your health care provider may recommend screening at age 14 if you are at increased risk. You will have tests every 1-10 years, depending on your results and the type of screening test.  Diabetes screening. This is done by checking your blood sugar (glucose) after you have not eaten  for a while (fasting). You may have this done every 1-3 years.  Sexually transmitted disease (STD) testing. Follow these instructions at home: Eating and drinking  Eat a diet that includes fresh fruits and vegetables, whole grains, lean protein, and low-fat dairy products.  Take vitamin and mineral supplements as  recommended by your health care provider.  Do not drink alcohol if your health care provider tells you not to drink.  If you drink alcohol: ? Limit how much you have to 0-2 drinks a day. ? Be aware of how much alcohol is in your drink. In the [place] one drink equals one 12 oz bottle of beer (355 mL), one 5 oz glass of wine (148 mL), or one 1 oz glass of hard liquor (44 mL). Lifestyle  Take daily care of your teeth and gums.  Stay active. Exercise for at least 30 minutes on 5 or more days each week.  Do not use any products that contain nicotine or tobacco, such as cigarettes, e-cigarettes, and chewing tobacco. If you need help quitting, ask your health care provider.  If you are sexually active, practice safe sex. Use a condom or other form of protection to prevent STIs (sexually transmitted infections).  Talk with your health care provider about taking a low-dose aspirin every day starting at age 53. What's next?  Go to your health care provider once a year for a well check visit.  Ask your health care provider how often you should have your eyes and teeth checked.  Stay up to date on all vaccines. This information is not intended to replace advice given to you by your health care provider. Make sure you discuss any questions you have with your health care provider. Document Revised: 11/18/2018 Document Reviewed: 11/18/2018 Elsevier Patient Education  2020 [employer].

## 2020-02-02 LAB — COMPLETE METABOLIC PANEL WITH GFR
AG Ratio: 2 (calc) (ref 1.0–2.5)
ALT: 14 U/L (ref 9–46)
AST: 21 U/L (ref 10–35)
Albumin: 4.7 g/dL (ref 3.6–5.1)
Alkaline phosphatase (APISO): 53 U/L (ref 35–144)
BUN: 13 mg/dL (ref 7–25)
CO2: 30 mmol/L (ref 20–32)
Calcium: 9.5 mg/dL (ref 8.6–10.3)
Chloride: 98 mmol/L (ref 98–110)
Creat: 1.17 mg/dL (ref 0.70–1.33)
GFR, Est African American: 84 mL/min/{1.73_m2} (ref >=60)
GFR, Est Non African American: 72 mL/min/{1.73_m2} (ref >=60)
Globulin: 2.4 g/dL (calc) (ref 1.9–3.7)
Glucose, Bld: 95 mg/dL (ref 65–99)
Potassium: 4.9 mmol/L (ref 3.5–5.3)
Sodium: 134 mmol/L — ABNORMAL LOW (ref 135–146)
Total Bilirubin: 0.7 mg/dL (ref 0.2–1.2)
Total Protein: 7.1 g/dL (ref 6.1–8.1)

## 2020-02-02 LAB — CBC
HCT: 39.2 % (ref 38.5–50.0)
Hemoglobin: 13.8 g/dL (ref 13.2–17.1)
MCH: 31.7 pg (ref 27.0–33.0)
MCHC: 35.2 g/dL (ref 32.0–36.0)
MCV: 90.1 fL (ref 80.0–100.0)
MPV: 9.5 fL (ref 7.5–12.5)
Platelets: 234 10*3/uL (ref 140–400)
RBC: 4.35 10*6/uL (ref 4.20–5.80)
RDW: 12.8 % (ref 11.0–15.0)
WBC: 5 10*3/uL (ref 3.8–10.8)

## 2020-02-02 LAB — TSH: TSH: 1.51 mIU/L (ref 0.40–4.50)

## 2020-02-02 LAB — LIPID PANEL
Cholesterol: 169 mg/dL (ref <200)
HDL: 58 mg/dL (ref >=40)
LDL Cholesterol (Calc): 96 mg/dL (calc)
Non-HDL Cholesterol (Calc): 111 mg/dL (calc) (ref <130)
Total CHOL/HDL Ratio: 2.9 (calc) (ref <5.0)
Triglycerides: 67 mg/dL (ref <150)

## 2020-02-02 LAB — PSA: PSA: 0.9 ng/mL (ref <=4.0)

## 2020-02-09 ENCOUNTER — Other Ambulatory Visit: Payer: Self-pay | Admitting: Family Medicine

## 2020-02-09 ENCOUNTER — Ambulatory Visit (AMBULATORY_SURGERY_CENTER): Payer: Self-pay | Admitting: *Deleted

## 2020-02-09 ENCOUNTER — Other Ambulatory Visit: Payer: Self-pay

## 2020-02-09 VITALS — Temp 97.3°F | Ht 76.0 in | Wt 240.0 lb

## 2020-02-09 DIAGNOSIS — Z1211 Encounter for screening for malignant neoplasm of colon: Secondary | ICD-10-CM

## 2020-02-09 DIAGNOSIS — Z01818 Encounter for other preprocedural examination: Secondary | ICD-10-CM

## 2020-02-09 MED ORDER — CLENPIQ 10-3.5-12 MG-GM -GM/160ML PO SOLN
1.0000 | ORAL | 0 refills | Status: DC
Start: 2020-02-09 — End: 2021-10-02

## 2020-02-09 NOTE — Progress Notes (Signed)
Patient is here in-person for PV. Patient denies any allergies to eggs or soy. Patient denies any problems with anesthesia/sedation. Patient denies any oxygen use at home. Patient denies taking any diet/weight loss medications or blood thinners. Patient is not being treated for MRSA or C-diff. EMMI education assisgned to the patient for the procedure, this was explained and instructions given to patient. COVID-19 screening test is on 3/15, the pt is aware. Pt is aware that care partner will wait in the car during procedure; if they feel like they will be too hot or cold to wait in the car; they may wait in the 4 th floor lobby. Patient is aware to bring only one care partner. We want them to wear a mask (we do not have any that we can provide them), practice social distancing, and we will check their temperatures when they get here.  I did remind the patient that their care partner needs to stay in the parking lot the entire time and have a cell phone available, we will call them when the pt is ready for discharge. Patient will wear mask into building.   Clenpiq coupon given to the patient.

## 2020-02-20 ENCOUNTER — Other Ambulatory Visit: Payer: Self-pay | Admitting: Gastroenterology

## 2020-02-20 ENCOUNTER — Ambulatory Visit (INDEPENDENT_AMBULATORY_CARE_PROVIDER_SITE_OTHER): Payer: 59

## 2020-02-20 ENCOUNTER — Other Ambulatory Visit: Payer: Self-pay

## 2020-02-20 ENCOUNTER — Encounter: Payer: Self-pay | Admitting: Gastroenterology

## 2020-02-20 DIAGNOSIS — Z1159 Encounter for screening for other viral diseases: Secondary | ICD-10-CM

## 2020-02-20 LAB — SARS CORONAVIRUS 2 (TAT 6-24 HRS): SARS Coronavirus 2: NEGATIVE

## 2020-02-23 ENCOUNTER — Ambulatory Visit (AMBULATORY_SURGERY_CENTER): Payer: 59 | Admitting: Gastroenterology

## 2020-02-23 ENCOUNTER — Encounter: Payer: Self-pay | Admitting: Gastroenterology

## 2020-02-23 ENCOUNTER — Other Ambulatory Visit: Payer: Self-pay

## 2020-02-23 VITALS — BP 125/76 | HR 59 | Temp 97.7°F | Resp 16 | Ht 76.0 in | Wt 240.0 lb

## 2020-02-23 DIAGNOSIS — D123 Benign neoplasm of transverse colon: Secondary | ICD-10-CM

## 2020-02-23 DIAGNOSIS — K5730 Diverticulosis of large intestine without perforation or abscess without bleeding: Secondary | ICD-10-CM

## 2020-02-23 DIAGNOSIS — K640 First degree hemorrhoids: Secondary | ICD-10-CM

## 2020-02-23 DIAGNOSIS — Z1211 Encounter for screening for malignant neoplasm of colon: Secondary | ICD-10-CM

## 2020-02-23 MED ORDER — SODIUM CHLORIDE 0.9 % IV SOLN: 500.0000 mL | INTRAVENOUS | Status: DC

## 2020-02-23 NOTE — Op Note (Signed)
[facility] Patient Name: David Avila Procedure Date: 02/23/2020 8:35 AM MRN: [id number] Endoscopist: Gerrit Heck , MD Age: 49 Referring MD:  Date of Birth: 12-Jun-1971 Gender: Male Account #: [id number] Procedure:                Colonoscopy Indications:              Screening for colorectal malignant neoplasm, This                            is the patient's first colonoscopy Medicines:                Monitored Anesthesia Care Procedure:                Pre-Anesthesia Assessment:                           - Prior to the procedure, a History and Physical                            was performed, and patient medications and                            allergies were reviewed. The patient's tolerance of                            previous anesthesia was also reviewed. The risks                            and benefits of the procedure and the sedation                            options and risks were discussed with the patient.                            All questions were answered, and informed consent                            was obtained. Prior Anticoagulants: The patient has                            taken no previous anticoagulant or antiplatelet                            agents. ASA Grade Assessment: II - A patient with                            mild systemic disease. After reviewing the risks                            and benefits, the patient was deemed in                            satisfactory condition to undergo the procedure.  After obtaining informed consent, the colonoscope                            was passed under direct vision. Throughout the                            procedure, the patient's blood pressure, pulse, and                            oxygen saturations were monitored continuously. The                            Colonoscope was introduced through the anus and                            advanced to the the terminal  ileum. The colonoscopy                            was performed without difficulty. The patient                            tolerated the procedure well. The quality of the                            bowel preparation was adequate. The terminal ileum,                            ileocecal valve, appendiceal orifice, and rectum                            were photographed. Scope In: 8:46:09 AM Scope Out: 9:05:31 AM Scope Withdrawal Time: 0 hours 13 minutes 15 seconds  Total Procedure Duration: 0 hours 19 minutes 22 seconds  Findings:                 The perianal and digital rectal examinations were                            normal.                           A 2 mm polyp was found in the transverse colon. The                            polyp was sessile. The polyp was removed with a                            cold snare. Resection and retrieval were complete.                            Estimated blood loss was minimal.                           Multiple small and large-mouthed diverticula were  found in the sigmoid colon and cecum.                           Non-bleeding internal hemorrhoids were found during                            retroflexion. The hemorrhoids were small.                           The terminal ileum appeared normal. Complications:            No immediate complications. Estimated Blood Loss:     Estimated blood loss was minimal. Impression:               - One 2 mm polyp in the transverse colon, removed                            with a cold snare. Resected and retrieved.                           - Diverticulosis in the sigmoid colon and in the                            cecum.                           - Non-bleeding internal hemorrhoids.                           - The examined portion of the ileum was normal. Recommendation:           - Patient has a contact number available for                            emergencies. The signs and symptoms  of potential                            delayed complications were discussed with the                            patient. Return to normal activities tomorrow.                            Written discharge instructions were provided to the                            patient.                           - Resume previous diet.                           - Continue present medications.                           - Await pathology results.                           -  Repeat colonoscopy in 7-10 years for surveillance                            based on pathology results.                           - Return to GI office PRN. Gerrit Heck, MD 02/23/2020 9:16:07 AM

## 2020-02-23 NOTE — Progress Notes (Signed)
A/ox3, pleased with MAC, report to RN 

## 2020-02-23 NOTE — Patient Instructions (Signed)
Discharge instructions given. Handouts on polyps,diverticulosis and Hemorrhoids. Resume previous medications. YOU HAD AN ENDOSCOPIC PROCEDURE TODAY AT THE [facility]:   Refer to the procedure report that was given to you for any specific questions about what was found during the examination.  If the procedure report does not answer your questions, please call your gastroenterologist to clarify.  If you requested that your care partner not be given the details of your procedure findings, then the procedure report has been included in a sealed envelope for you to review at your convenience later.  YOU SHOULD EXPECT: Some feelings of bloating in the abdomen. Passage of more gas than usual.  Walking can help get rid of the air that was put into your GI tract during the procedure and reduce the bloating. If you had a lower endoscopy (such as a colonoscopy or flexible sigmoidoscopy) you may notice spotting of blood in your stool or on the toilet paper. If you underwent a bowel prep for your procedure, you may not have a normal bowel movement for a few days.  Please Note:  You might notice some irritation and congestion in your nose or some drainage.  This is from the oxygen used during your procedure.  There is no need for concern and it should clear up in a day or so.  SYMPTOMS TO REPORT IMMEDIATELY:  Following lower endoscopy (colonoscopy or flexible sigmoidoscopy):  Excessive amounts of blood in the stool  Significant tenderness or worsening of abdominal pains  Swelling of the abdomen that is new, acute  Fever of 100F or higher   For urgent or emergent issues, a gastroenterologist can be reached at any hour by calling [phone]. Do not use MyChart messaging for urgent concerns.    DIET:  We do recommend a small meal at first, but then you may proceed to your regular diet.  Drink plenty of fluids but you should avoid alcoholic beverages for 24 hours.  ACTIVITY:  You should  plan to take it easy for the rest of today and you should NOT DRIVE or use heavy machinery until tomorrow (because of the sedation medicines used during the test).    FOLLOW UP: Our staff will call the number listed on your records 48-72 hours following your procedure to check on you and address any questions or concerns that you may have regarding the information given to you following your procedure. If we do not reach you, we will leave a message.  We will attempt to reach you two times.  During this call, we will ask if you have developed any symptoms of COVID 19. If you develop any symptoms (ie: fever, flu-like symptoms, shortness of breath, cough etc.) before then, please call [phone].  If you test positive for Covid 19 in the 2 weeks post procedure, please call and report this information to us.    If any biopsies were taken you will be contacted by phone or by letter within the next 1-3 weeks.  Please call us at [phone] if you have not heard about the biopsies in 3 weeks.    SIGNATURES/CONFIDENTIALITY: You and/or your care partner have signed paperwork which will be entered into your electronic medical record.  These signatures attest to the fact that that the information above on your After Visit Summary has been reviewed and is understood.  Full responsibility of the confidentiality of this discharge information lies with you and/or your care-partner.   

## 2020-02-23 NOTE — Progress Notes (Signed)
I have reviewed the patient's medical history in detail and updated the computerized patient record. Temp JB V/s CW

## 2020-02-23 NOTE — Progress Notes (Signed)
Called to room to assist during endoscopic procedure.  Patient ID and intended procedure confirmed with present staff. Received instructions for my participation in the procedure from the performing physician.  

## 2020-02-27 ENCOUNTER — Telehealth: Payer: Self-pay | Admitting: *Deleted

## 2020-02-27 ENCOUNTER — Telehealth: Payer: Self-pay

## 2020-02-27 NOTE — Telephone Encounter (Signed)
Left message on 2nd follow up call. 

## 2020-03-02 ENCOUNTER — Encounter: Payer: Self-pay | Admitting: Gastroenterology

## 2020-08-27 ENCOUNTER — Encounter: Payer: Self-pay | Admitting: Family Medicine

## 2020-08-27 ENCOUNTER — Other Ambulatory Visit: Payer: Self-pay

## 2020-08-27 ENCOUNTER — Ambulatory Visit (INDEPENDENT_AMBULATORY_CARE_PROVIDER_SITE_OTHER): Payer: 59 | Admitting: Family Medicine

## 2020-08-27 ENCOUNTER — Ambulatory Visit: Payer: Self-pay

## 2020-08-27 VITALS — BP 132/78 | HR 63 | Ht 76.0 in | Wt 240.0 lb

## 2020-08-27 DIAGNOSIS — M25511 Pain in right shoulder: Secondary | ICD-10-CM

## 2020-08-27 DIAGNOSIS — M24111 Other articular cartilage disorders, right shoulder: Secondary | ICD-10-CM | POA: Insufficient documentation

## 2020-08-27 MED ORDER — PREDNISONE 5 MG PO TABS: ORAL_TABLET | ORAL | 0 refills | Status: DC

## 2020-08-27 NOTE — Patient Instructions (Signed)
Nice to meet you Please try the prednisone Please use ice as needed  Please try the exercises   Please send me a message in MyChart with any questions or updates.  Please see me back in 3-4 weeks.   --Dr. Raeford Razor

## 2020-08-27 NOTE — Assessment & Plan Note (Signed)
Likely irritation of the labrum with no effusion noted on exam.  Does have a subacromial bursitis as well. -Counseled on home exercise therapy and supportive care. -Prednisone. -Could consider injection or physical therapy.

## 2020-08-27 NOTE — Progress Notes (Signed)
KANAAN KAGAWA - 49 y.o. male MRN [id number]  Date of birth: [birth year]  SUBJECTIVE:  Including CC & ROS.  Chief Complaint  Patient presents with  . Shoulder Pain    right x 1-2 weeks    URBANO MILHOUSE is a 49 y.o. male that is presenting with right shoulder pain.  Pain is been ongoing for a few weeks.  He first noticed the pain after doing a pull-up.  He has had subsequent pain with bench press.  No history of surgery.  Feels like the pain may be getting worse.  Is localized to the shoulder.  No history of similar pain.   Review of Systems See HPI   HISTORY: Past Medical, Surgical, Social, and Family History Reviewed & Updated per EMR.   Pertinent Historical Findings include:  Past Medical History:  Diagnosis Date  . Cancer ([place])    melanoma -insitu-left foot  . Hypertension     Past Surgical History:  Procedure Laterality Date  . HERNIA REPAIR  1998    Family History  Problem Relation Age of Onset  . COPD Mother   . Diabetes Mother   . Early death Mother   . Hyperlipidemia Mother   . Hypertension Mother   . Stroke Mother   . Alcohol abuse Father   . Cancer Father   . Early death Father   . COPD Maternal Grandmother   . Early death Maternal Grandmother   . Early death Maternal Grandfather   . Hyperlipidemia Maternal Grandfather   . Cancer Paternal Grandmother   . Colon cancer Paternal Grandmother   . Heart disease Paternal Grandfather   . Colon polyps Neg Hx   . Esophageal cancer Neg Hx   . Rectal cancer Neg Hx   . Stomach cancer Neg Hx     Social History   Socioeconomic History  . Marital status: Married    Spouse name: Not on file  . Number of children: Not on file  . Years of education: Not on file  . Highest education level: Not on file  Occupational History  . Not on file  Tobacco Use  . Smoking status: Never Smoker  . Smokeless tobacco: Never Used  Vaping Use  . Vaping Use: Never used  Substance and Sexual Activity  . Alcohol use: Yes     Alcohol/week: 6.0 standard drinks    Types: 6 Cans of beer per week  . Drug use: Never  . Sexual activity: Yes  Other Topics Concern  . Not on file  Social History Narrative  . Not on file   Social Determinants of Health   Financial Resource Strain:   . Difficulty of Paying Living Expenses: Not on file  Food Insecurity:   . Worried About Charity fundraiser in the Last Year: Not on file  . Ran Out of Food in the Last Year: Not on file  Transportation Needs:   . Lack of Transportation (Medical): Not on file  . Lack of Transportation (Non-Medical): Not on file  Physical Activity:   . Days of Exercise per Week: Not on file  . Minutes of Exercise per Session: Not on file  Stress:   . Feeling of Stress : Not on file  Social Connections:   . Frequency of Communication with Friends and Family: Not on file  . Frequency of Social Gatherings with Friends and Family: Not on file  . Attends Religious Services: Not on file  . Active Member of Clubs or Organizations:  Not on file  . Attends Archivist Meetings: Not on file  . Marital Status: Not on file  Intimate Partner Violence:   . Fear of Current or Ex-Partner: Not on file  . Emotionally Abused: Not on file  . Physically Abused: Not on file  . Sexually Abused: Not on file     PHYSICAL EXAM:  VS: BP 132/78   Pulse 63   Ht 6\' 4"  (1.93 m)   Wt 240 lb (108.9 kg)   BMI 29.21 kg/m  Physical Exam Gen: NAD, alert, cooperative with exam, well-appearing MSK:  Right shoulder: Normal internal and external rotation. Normal external rotation and abduction. Pain with empty can testing. Pain with speeds test. Normal O'Brien's test. Neurovascularly intact  Limited ultrasound: Right shoulder:  Normal-appearing biceps tendon but it does have an encircling effusion. Normal-appearing subscapularis. There is an anechoic change at the insertion of the supraspinatus suggest a possible tear.  There is no hyperemia associated with  this area.  There is no overlying bursitis. No significant change in the posterior glenohumeral joint.  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  Does have a subacromial bursitis as well. -Counseled on home exercise therapy and supportive care. -Prednisone. -Could consider injection or physical therapy.

## 2020-09-17 ENCOUNTER — Other Ambulatory Visit: Payer: Self-pay

## 2020-09-17 ENCOUNTER — Encounter: Payer: Self-pay | Admitting: Family Medicine

## 2020-09-17 ENCOUNTER — Ambulatory Visit: Payer: 59 | Admitting: Family Medicine

## 2020-09-17 DIAGNOSIS — M24111 Other articular cartilage disorders, right shoulder: Secondary | ICD-10-CM

## 2020-09-17 NOTE — Assessment & Plan Note (Signed)
Pain has improved with taking a break from working out.  Did get improvement with the steroid. -Counseled on home exercise therapy and supportive care. -Could consider for further.jsinject

## 2020-09-17 NOTE — Progress Notes (Signed)
David Avila - 49 y.o. male MRN [id number]  Date of birth: 12-Jun-1971  SUBJECTIVE:  Including CC & ROS.  Chief Complaint  Patient presents with  . Follow-up    right shoulder    David Avila is a 49 y.o. male that is following up for his right shoulder pain.  He did get improvement with the prednisone.  He started having some the pain returned after he finished the course of the steroids.  Had ongoing pain while lifting.  Has taken some time off from lifting and his pain has started to improve.   Review of Systems See HPI   HISTORY: Past Medical, Surgical, Social, and Family History Reviewed & Updated per EMR.   Pertinent Historical Findings include:  Past Medical History:  Diagnosis Date  . Cancer ([place])    melanoma -insitu-left foot  . Hypertension     Past Surgical History:  Procedure Laterality Date  . HERNIA REPAIR  1998    Family History  Problem Relation Age of Onset  . COPD Mother   . Diabetes Mother   . Early death Mother   . Hyperlipidemia Mother   . Hypertension Mother   . Stroke Mother   . Alcohol abuse Father   . Cancer Father   . Early death Father   . COPD Maternal Grandmother   . Early death Maternal Grandmother   . Early death Maternal Grandfather   . Hyperlipidemia Maternal Grandfather   . Cancer Paternal Grandmother   . Colon cancer Paternal Grandmother   . Heart disease Paternal Grandfather   . Colon polyps Neg Hx   . Esophageal cancer Neg Hx   . Rectal cancer Neg Hx   . Stomach cancer Neg Hx     Social History   Socioeconomic History  . Marital status: Married    Spouse name: Not on file  . Number of children: Not on file  . Years of education: Not on file  . Highest education level: Not on file  Occupational History  . Not on file  Tobacco Use  . Smoking status: Never Smoker  . Smokeless tobacco: Never Used  Vaping Use  . Vaping Use: Never used  Substance and Sexual Activity  . Alcohol use: Yes    Alcohol/week: 6.0  standard drinks    Types: 6 Cans of beer per week  . Drug use: Never  . Sexual activity: Yes  Other Topics Concern  . Not on file  Social History Narrative  . Not on file   Social Determinants of Health   Financial Resource Strain:   . Difficulty of Paying Living Expenses: Not on file  Food Insecurity:   . Worried About Charity fundraiser in the Last Year: Not on file  . Ran Out of Food in the Last Year: Not on file  Transportation Needs:   . Lack of Transportation (Medical): Not on file  . Lack of Transportation (Non-Medical): Not on file  Physical Activity:   . Days of Exercise per Week: Not on file  . Minutes of Exercise per Session: Not on file  Stress:   . Feeling of Stress : Not on file  Social Connections:   . Frequency of Communication with Friends and Family: Not on file  . Frequency of Social Gatherings with Friends and Family: Not on file  . Attends Religious Services: Not on file  . Active Member of Clubs or Organizations: Not on file  . Attends Archivist Meetings:  Not on file  . Marital Status: Not on file  Intimate Partner Violence:   . Fear of Current or Ex-Partner: Not on file  . Emotionally Abused: Not on file  . Physically Abused: Not on file  . 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  Did get improvement with the steroid. -Counseled on home exercise therapy and supportive care. -Could consider for further.jsinject

## 2021-02-05 ENCOUNTER — Encounter: Payer: 59 | Admitting: Family Medicine

## 2021-02-11 ENCOUNTER — Encounter: Payer: 59 | Admitting: Family Medicine

## 2021-03-13 ENCOUNTER — Other Ambulatory Visit: Payer: Self-pay | Admitting: Family Medicine

## 2021-09-11 ENCOUNTER — Encounter: Payer: 59 | Admitting: Family Medicine

## 2021-10-02 ENCOUNTER — Ambulatory Visit (INDEPENDENT_AMBULATORY_CARE_PROVIDER_SITE_OTHER): Payer: 59 | Admitting: Family Medicine

## 2021-10-02 ENCOUNTER — Encounter: Payer: Self-pay | Admitting: Family Medicine

## 2021-10-02 VITALS — BP 129/64 | HR 62 | Temp 97.8°F | Ht 76.0 in | Wt 240.0 lb

## 2021-10-02 DIAGNOSIS — Z13220 Encounter for screening for lipoid disorders: Secondary | ICD-10-CM | POA: Diagnosis not present

## 2021-10-02 DIAGNOSIS — Z125 Encounter for screening for malignant neoplasm of prostate: Secondary | ICD-10-CM

## 2021-10-02 DIAGNOSIS — Z23 Encounter for immunization: Secondary | ICD-10-CM | POA: Diagnosis not present

## 2021-10-02 DIAGNOSIS — Z0000 Encounter for general adult medical examination without abnormal findings: Secondary | ICD-10-CM

## 2021-10-02 DIAGNOSIS — N529 Male erectile dysfunction, unspecified: Secondary | ICD-10-CM

## 2021-10-02 MED ORDER — TADALAFIL 20 MG PO TABS: 10.0000 mg | ORAL_TABLET | ORAL | 11 refills | Status: DC | PRN

## 2021-10-02 NOTE — Patient Instructions (Signed)
Preventive Care 40-52 Years Old, Male Preventive care refers to lifestyle choices and visits with your health care provider that can promote health and wellness. This includes: A yearly physical exam. This is also called an annual wellness visit. Regular dental and eye exams. Immunizations. Screening for certain conditions. Healthy lifestyle choices, such as: Eating a healthy diet. Getting regular exercise. Not using drugs or products that contain nicotine and tobacco. Limiting alcohol use. What can I expect for my preventive care visit? Physical exam Your health care provider will check your: Height and weight. These may be used to calculate your BMI (body mass index). BMI is a measurement that tells if you are at a healthy weight. Heart rate and blood pressure. Body temperature. Skin for abnormal spots. Counseling Your health care provider may ask you questions about your: Past medical problems. Family's medical history. Alcohol, tobacco, and drug use. Emotional well-being. Home life and relationship well-being. Sexual activity. Diet, exercise, and sleep habits. Work and work environment. Access to firearms. What immunizations do I need? Vaccines are usually given at various ages, according to a schedule. Your health care provider will recommend vaccines for you based on your age, medical history, and lifestyle or other factors, such as travel or where you work. What tests do I need? Blood tests Lipid and cholesterol levels. These may be checked every 5 years, or more often if you are over 50 years old. Hepatitis C test. Hepatitis B test. Screening Lung cancer screening. You may have this screening every year starting at age 55 if you have a 30-pack-year history of smoking and currently smoke or have quit within the past 15 years. Prostate cancer screening. Recommendations will vary depending on your family history and other risks. Genital exam to check for testicular cancer  or hernias. Colorectal cancer screening. All adults should have this screening starting at age 50 and continuing until age 75. Your health care provider may recommend screening at age 45 if you are at increased risk. You will have tests every 1-10 years, depending on your results and the type of screening test. Diabetes screening. This is done by checking your blood sugar (glucose) after you have not eaten for a while (fasting). You may have this done every 1-3 years. STD (sexually transmitted disease) testing, if you are at risk. Follow these instructions at home: Eating and drinking  Eat a diet that includes fresh fruits and vegetables, whole grains, lean protein, and low-fat dairy products. Take vitamin and mineral supplements as recommended by your health care provider. Do not drink alcohol if your health care provider tells you not to drink. If you drink alcohol: Limit how much you have to 0-2 drinks a day. Be aware of how much alcohol is in your drink. In the [place] one drink equals one 12 oz bottle of beer (355 mL), one 5 oz glass of wine (148 mL), or one 1 oz glass of hard liquor (44 mL). Lifestyle Take daily care of your teeth and gums. Brush your teeth every morning and night with fluoride toothpaste. Floss one time each day. Stay active. Exercise for at least 30 minutes 5 or more days each week. Do not use any products that contain nicotine or tobacco, such as cigarettes, e-cigarettes, and chewing tobacco. If you need help quitting, ask your health care provider. Do not use drugs. If you are sexually active, practice safe sex. Use a condom or other form of protection to prevent STIs (sexually transmitted infections). If told by your   health care provider, take low-dose aspirin daily starting at age 50. Find healthy ways to cope with stress, such as: Meditation, yoga, or listening to music. Journaling. Talking to a trusted person. Spending time with friends and  family. Safety Always wear your seat belt while driving or riding in a vehicle. Do not drive: If you have been drinking alcohol. Do not ride with someone who has been drinking. When you are tired or distracted. While texting. Wear a helmet and other protective equipment during sports activities. If you have firearms in your house, make sure you follow all gun safety procedures. What's next? Go to your health care provider once a year for an annual wellness visit. Ask your health care provider how often you should have your eyes and teeth checked. Stay up to date on all vaccines. This information is not intended to replace advice given to you by your health care provider. Make sure you discuss any questions you have with your health care provider. Document Revised: 02/01/2021 Document Reviewed: 11/18/2018 Elsevier Patient Education  2022 Elsevier Inc.   

## 2021-10-02 NOTE — Assessment & Plan Note (Signed)
Well adult Orders Placed This Encounter  Procedures  . Flu Vaccine QUAD 6+ mos PF IM (Fluarix Quad PF)  . Varicella-zoster vaccine IM (Shingrix)  . COMPLETE METABOLIC PANEL WITH GFR  . CBC with Differential  . Lipid Panel w/reflex Direct LDL  . PSA  Screening: PSA, lipid panel Immunizations: Flu vaccine and Shingrix No. 1 Anticipatory guidance/risk factor reduction: Recommendations per AVS.

## 2021-10-02 NOTE — Assessment & Plan Note (Signed)
He is interested in trying medication to help with this.  Adding tadalafil 10 to 20 mg as needed.  We discussed precautions with medication including avoiding nitrates.  Side effects reviewed including red flags such as prolonged erection.

## 2021-10-02 NOTE — Progress Notes (Signed)
David Avila - 50 y.o. male MRN [id number]  Date of birth: 1971/09/24  Subjective Chief Complaint  Patient presents with   Annual Exam    HPI David Avila is a 50 year old male here today for annual exam.  He reports he is doing well and denies any changes to his health since his last visit.  He continues to have some erectile difficulty.  He is interested in trying medication to help with this.  He has never taken medication in the past.  He does continue to exercise regularly and does CrossFit.  He feels like his diet is pretty healthy.  He is a non-smoker.  He consumes alcohol occasionally.  He does see dermatology regularly due to history of melanoma.  He would like to have shingles and flu vaccines today.  Review of Systems  Constitutional:  Negative for chills, fever, malaise/fatigue and weight loss.  HENT:  Negative for congestion, ear pain and sore throat.   Eyes:  Negative for blurred vision, double vision and pain.  Respiratory:  Negative for cough and shortness of breath.   Cardiovascular:  Negative for chest pain and palpitations.  Gastrointestinal:  Negative for abdominal pain, blood in stool, constipation, heartburn and nausea.  Genitourinary:  Negative for dysuria and urgency.  Musculoskeletal:  Negative for joint pain and myalgias.  Neurological:  Negative for dizziness and headaches.  Endo/Heme/Allergies:  Does not bruise/bleed easily.  Psychiatric/Behavioral:  Negative for depression. The patient is not nervous/anxious and does not have insomnia.    No Known Allergies  Past Medical History:  Diagnosis Date   Cancer ([place])    melanoma -insitu-left foot   Hypertension     Past Surgical History:  Procedure Laterality Date   HERNIA REPAIR  1998    Social History   Socioeconomic History   Marital status: Married    Spouse name: Not on file   Number of children: Not on file   Years of education: Not on file   Highest education level: Not on file   Occupational History   Not on file  Tobacco Use   Smoking status: Never   Smokeless tobacco: Never  Vaping Use   Vaping Use: Never used  Substance and Sexual Activity   Alcohol use: Yes    Alcohol/week: 6.0 standard drinks    Types: 6 Cans of beer per week   Drug use: Never   Sexual activity: Yes  Other Topics Concern   Not on file  Social History Narrative   Not on file   Social Determinants of Health   Financial Resource Strain: Not on file  Food Insecurity: Not on file  Transportation Needs: Not on file  Physical Activity: Not on file  Stress: Not on file  Social Connections: Not on file    Family History  Problem Relation Age of Onset   COPD Mother    Diabetes Mother    Early death Mother    Hyperlipidemia Mother    Hypertension Mother    Stroke Mother    Alcohol abuse Father    Cancer Father    Early death Father    COPD Maternal Grandmother    Early death Maternal Grandmother    Early death Maternal Grandfather    Hyperlipidemia Maternal Grandfather    Cancer Paternal Grandmother    Colon cancer Paternal Grandmother    Heart disease Paternal Grandfather    Colon polyps Neg Hx    Esophageal cancer Neg Hx    Rectal cancer Neg Hx  Stomach cancer Neg Hx     Health Maintenance  Topic Date Due   HIV Screening  Never done   Hepatitis C Screening  Never done   TETANUS/TDAP  02/03/2022 (Originally 08/03/1988)   Zoster Vaccines- Shingrix (2 of 2) 11/27/2021   COLONOSCOPY (Pts 45-35yrs Insurance coverage will need to be confirmed)  02/23/2027   INFLUENZA VACCINE  Completed   Pneumococcal Vaccine 17-4 Years old  Aged Out   HPV VACCINES  Aged Out     ----------------------------------------------------------------------------------------------------------------------------------------------------------------------------------------------------------------- Physical Exam BP 129/64 (BP Location: Left Arm, Patient Position: Sitting, Cuff Size: Large)    Pulse 62   Temp 97.8 F (36.6 C)   Ht 6\' 4"  (1.93 m)   Wt 240 lb (108.9 kg)   SpO2 100%   BMI 29.21 kg/m   Physical Exam Constitutional:      General: He is not in acute distress. HENT:     Head: Normocephalic and atraumatic.     Right Ear: Tympanic membrane and external ear normal.     Left Ear: Tympanic membrane and external ear normal.  Eyes:     General: No scleral icterus. Neck:     Thyroid: No thyromegaly.  Cardiovascular:     Rate and Rhythm: Normal rate and regular rhythm.     Heart sounds: Normal heart sounds.  Pulmonary:     Effort: Pulmonary effort is normal.     Breath sounds: Normal breath sounds.  Abdominal:     General: Bowel sounds are normal. There is no distension.     Palpations: Abdomen is soft.     Tenderness: There is no abdominal tenderness. There is no guarding.  Musculoskeletal:     Cervical back: Normal range of motion.  Lymphadenopathy:     Cervical: No cervical adenopathy.  Skin:    General: Skin is warm and dry.     Findings: No rash.  Neurological:     Mental Status: He is alert and oriented to person, place, and time.     Cranial Nerves: No cranial nerve deficit.     Motor: No abnormal muscle tone.  Psychiatric:        Mood and Affect: Mood normal.        Behavior: Behavior normal.    ------------------------------------------------------------------------------------------------------------------------------------------------------------------------------------------------------------------- Assessment and Plan  Erectile dysfunction He is interested in trying medication to help with this.  Adding tadalafil 10 to 20 mg as needed.  We discussed precautions with medication including avoiding nitrates.  Side effects reviewed including red flags such as prolonged erection.  Well adult exam Well adult Orders Placed This Encounter  Procedures   Flu Vaccine QUAD 6+ mos PF IM (Fluarix Quad PF)   Varicella-zoster vaccine IM (Shingrix)    COMPLETE METABOLIC PANEL WITH GFR   CBC with Differential   Lipid Panel w/reflex Direct LDL   PSA  Screening: PSA, lipid panel Immunizations: Flu vaccine and Shingrix No. 1 Anticipatory guidance/risk factor reduction: Recommendations per AVS.   Meds ordered this encounter  Medications   tadalafil (CIALIS) 20 MG tablet    Sig: Take 0.5-1 tablets (10-20 mg total) by mouth every other day as needed for erectile dysfunction.    Dispense:  10 tablet    Refill:  11    No follow-ups on file.    This visit occurred during the SARS-CoV-2 public health emergency.  Safety protocols were in place, including screening questions prior to the visit, additional usage of staff PPE, and extensive cleaning of exam room while observing appropriate  contact time as indicated for disinfecting solutions.

## 2021-10-03 LAB — CBC WITH DIFFERENTIAL/PLATELET
Absolute Monocytes: 581 {cells}/uL (ref 200–950)
Basophils Absolute: 51 {cells}/uL (ref 0–200)
Basophils Relative: 0.9 %
Eosinophils Absolute: 171 {cells}/uL (ref 15–500)
Eosinophils Relative: 3 %
HCT: 38 % — ABNORMAL LOW (ref 38.5–50.0)
Hemoglobin: 13.3 g/dL (ref 13.2–17.1)
Lymphs Abs: 2012 {cells}/uL (ref 850–3900)
MCH: 32.1 pg (ref 27.0–33.0)
MCHC: 35 g/dL (ref 32.0–36.0)
MCV: 91.8 fL (ref 80.0–100.0)
MPV: 9.5 fL (ref 7.5–12.5)
Monocytes Relative: 10.2 %
Neutro Abs: 2884 {cells}/uL (ref 1500–7800)
Neutrophils Relative %: 50.6 %
Platelets: 239 Thousand/uL (ref 140–400)
RBC: 4.14 Million/uL — ABNORMAL LOW (ref 4.20–5.80)
RDW: 12.9 % (ref 11.0–15.0)
Total Lymphocyte: 35.3 %
WBC: 5.7 Thousand/uL (ref 3.8–10.8)

## 2021-10-03 LAB — COMPLETE METABOLIC PANEL WITH GFR
AG Ratio: 2.1 (calc) (ref 1.0–2.5)
ALT: 15 U/L (ref 9–46)
AST: 24 U/L (ref 10–35)
Albumin: 4.6 g/dL (ref 3.6–5.1)
Alkaline phosphatase (APISO): 60 U/L (ref 35–144)
BUN: 9 mg/dL (ref 7–25)
CO2: 27 mmol/L (ref 20–32)
Calcium: 9.2 mg/dL (ref 8.6–10.3)
Chloride: 97 mmol/L — ABNORMAL LOW (ref 98–110)
Creat: 1.14 mg/dL (ref 0.70–1.30)
Globulin: 2.2 g/dL (calc) (ref 1.9–3.7)
Glucose, Bld: 88 mg/dL (ref 65–139)
Potassium: 4.6 mmol/L (ref 3.5–5.3)
Sodium: 133 mmol/L — ABNORMAL LOW (ref 135–146)
Total Bilirubin: 0.5 mg/dL (ref 0.2–1.2)
Total Protein: 6.8 g/dL (ref 6.1–8.1)
eGFR: 77 mL/min/{1.73_m2} (ref >=60)

## 2021-10-03 LAB — LIPID PANEL W/REFLEX DIRECT LDL
Cholesterol: 152 mg/dL (ref <200)
HDL: 70 mg/dL (ref >=40)
LDL Cholesterol (Calc): 69 mg/dL (calc)
Non-HDL Cholesterol (Calc): 82 mg/dL (calc) (ref <130)
Total CHOL/HDL Ratio: 2.2 (calc) (ref <5.0)
Triglycerides: 51 mg/dL (ref <150)

## 2021-10-03 LAB — PSA: PSA: 1 ng/mL (ref <=4.00)

## 2022-06-05 ENCOUNTER — Encounter (INDEPENDENT_AMBULATORY_CARE_PROVIDER_SITE_OTHER): Payer: 59 | Admitting: Family Medicine

## 2022-06-05 DIAGNOSIS — I10 Essential (primary) hypertension: Secondary | ICD-10-CM

## 2022-06-06 MED ORDER — OLMESARTAN MEDOXOMIL 20 MG PO TABS: ORAL_TABLET | ORAL | 0 refills | Status: DC

## 2022-06-06 NOTE — Telephone Encounter (Signed)
I spent 5 total minutes of online digital evaluation and management services in this patient-initiated request for online care. 

## 2022-06-14 ENCOUNTER — Other Ambulatory Visit: Payer: Self-pay | Admitting: Family Medicine

## 2022-06-16 MED ORDER — OLMESARTAN MEDOXOMIL 20 MG PO TABS: ORAL_TABLET | ORAL | 0 refills | Status: DC

## 2022-08-19 NOTE — Progress Notes (Unsigned)
   Acute Office Visit  Subjective:     Patient ID: David Avila, male    DOB: [birth year], 51 y.o.   MRN: [id number]  No chief complaint on file.   HPI Patient is in today for urgent care follow up. He was seen by urgent care   ROS      Objective:    There were no vitals taken for this visit. {Vitals History (Optional):23777}  Physical Exam  No results found for any visits on 08/20/22.      Assessment & Plan:   Problem List Items Addressed This Visit   None   No orders of the defined types were placed in this encounter.   No follow-ups on file.  David Loffler, DO

## 2022-08-20 ENCOUNTER — Ambulatory Visit: Payer: 59 | Admitting: Family Medicine

## 2022-08-20 ENCOUNTER — Encounter: Payer: Self-pay | Admitting: Family Medicine

## 2022-08-20 VITALS — BP 132/85 | HR 64 | Temp 98.8°F | Wt 240.0 lb

## 2022-08-20 DIAGNOSIS — H6593 Unspecified nonsuppurative otitis media, bilateral: Secondary | ICD-10-CM | POA: Diagnosis not present

## 2022-08-20 MED ORDER — FLUTICASONE PROPIONATE 50 MCG/ACT NA SUSP: 2.0000 | Freq: Every day | NASAL | 6 refills | Status: DC

## 2022-08-20 MED ORDER — DOXYCYCLINE HYCLATE 100 MG PO TABS: 100.0000 mg | ORAL_TABLET | Freq: Two times a day (BID) | ORAL | 0 refills | Status: AC

## 2022-08-20 MED ORDER — CETIRIZINE HCL 10 MG PO TABS: 10.0000 mg | ORAL_TABLET | Freq: Every day | ORAL | 0 refills | Status: AC

## 2022-08-20 NOTE — Patient Instructions (Signed)
-   finish augmentin and do flonase and zyrtec  - if still experiencing symptoms will go ahead and start doxycyline

## 2022-08-20 NOTE — Assessment & Plan Note (Addendum)
-   discussed with patient to continue augmentin course and if no better to start doxycycline - have also discussed zyrtec and flonase use  - if unable to get symptom resolution may need to see ENT for recurrent sinusitis/resistant ear infection

## 2022-10-14 ENCOUNTER — Ambulatory Visit (INDEPENDENT_AMBULATORY_CARE_PROVIDER_SITE_OTHER): Payer: 59 | Admitting: Family Medicine

## 2022-10-14 ENCOUNTER — Encounter: Payer: Self-pay | Admitting: Family Medicine

## 2022-10-14 VITALS — BP 130/76 | HR 63 | Ht 76.0 in | Wt 244.0 lb

## 2022-10-14 DIAGNOSIS — Z0000 Encounter for general adult medical examination without abnormal findings: Secondary | ICD-10-CM

## 2022-10-14 DIAGNOSIS — Z125 Encounter for screening for malignant neoplasm of prostate: Secondary | ICD-10-CM

## 2022-10-14 DIAGNOSIS — I10 Essential (primary) hypertension: Secondary | ICD-10-CM

## 2022-10-14 DIAGNOSIS — Z23 Encounter for immunization: Secondary | ICD-10-CM

## 2022-10-14 DIAGNOSIS — Z13220 Encounter for screening for lipoid disorders: Secondary | ICD-10-CM | POA: Diagnosis not present

## 2022-10-14 NOTE — Assessment & Plan Note (Signed)
Well adult Orders Placed This Encounter  Procedures   Flu Vaccine QUAD 6+ mos PF IM (Fluarix Quad PF)   Zoster Recombinant (Shingrix )   COMPLETE METABOLIC PANEL WITH GFR   CBC with Differential   Lipid Panel w/reflex Direct LDL   TSH   PSA  Screenings: Per lab orders Immunizations: Shingrix and flu given today. Anticipatory guidance/risk factor reduction: Recommendations per AVS.

## 2022-10-14 NOTE — Patient Instructions (Signed)

## 2022-10-14 NOTE — Progress Notes (Signed)
ISACC Avila - 51 y.o. male MRN [id number]  Date of birth: [birth year]  Subjective Chief Complaint  Patient presents with   Annual Exam    HPI David Avila is a 51 year old male here today for annual exam.  He reports he is doing well at this time.  Denies new concerns today.  Remains on olmesartan for management of hypertension.  This remains well controlled at current strength.  He continues to exercise regularly.  He feels like diet is pretty good most of the time.  He does have regular dental care.  He is a non-smoker.  He does consume alcohol occasionally on the weekends.  Immunizations: He would like to have flu and shingles today.  Review of Systems  Constitutional:  Negative for chills, fever, malaise/fatigue and weight loss.  HENT:  Negative for congestion, ear pain and sore throat.   Eyes:  Negative for blurred vision, double vision and pain.  Respiratory:  Negative for cough and shortness of breath.   Cardiovascular:  Negative for chest pain and palpitations.  Gastrointestinal:  Negative for abdominal pain, blood in stool, constipation, heartburn and nausea.  Genitourinary:  Negative for dysuria and urgency.  Musculoskeletal:  Negative for joint pain and myalgias.  Neurological:  Negative for dizziness and headaches.  Endo/Heme/Allergies:  Does not bruise/bleed easily.  Psychiatric/Behavioral:  Negative for depression. The patient is not nervous/anxious and does not have insomnia.     No Known Allergies  Past Medical History:  Diagnosis Date   Cancer ([place])    melanoma -insitu-left foot   Hypertension     Past Surgical History:  Procedure Laterality Date   HERNIA REPAIR  1998    Social History   Socioeconomic History   Marital status: Married    Spouse name: Not on file   Number of children: Not on file   Years of education: Not on file   Highest education level: Not on file  Occupational History   Not on file  Tobacco Use   Smoking status: Never    Smokeless tobacco: Never  Vaping Use   Vaping Use: Never used  Substance and Sexual Activity   Alcohol use: Yes    Alcohol/week: 6.0 standard drinks of alcohol    Types: 6 Cans of beer per week   Drug use: Never   Sexual activity: Yes  Other Topics Concern   Not on file  Social History Narrative   Not on file   Social Determinants of Health   Financial Resource Strain: Not on file  Food Insecurity: Not on file  Transportation Needs: Not on file  Physical Activity: Not on file  Stress: Not on file  Social Connections: Not on file    Family History  Problem Relation Age of Onset   COPD Mother    Diabetes Mother    Early death Mother    Hyperlipidemia Mother    Hypertension Mother    Stroke Mother    Alcohol abuse Father    Cancer Father    Early death Father    COPD Maternal Grandmother    Early death Maternal Grandmother    Early death Maternal Grandfather    Hyperlipidemia Maternal Grandfather    Cancer Paternal Grandmother    Colon cancer Paternal Grandmother    Heart disease Paternal Grandfather    Colon polyps Neg Hx    Esophageal cancer Neg Hx    Rectal cancer Neg Hx    Stomach cancer Neg Hx     Health Maintenance  Topic Date Due   TETANUS/TDAP  10/15/2023 (Originally 08/03/1988)   Hepatitis C Screening  10/15/2023 (Originally 08/04/1987)   HIV Screening  10/15/2023 (Originally 08/03/1984)   COLONOSCOPY (Pts 45-40yr Insurance coverage will need to be confirmed)  02/23/2027   INFLUENZA VACCINE  Completed   Zoster Vaccines- Shingrix  Completed   HPV VACCINES  Aged Out     ----------------------------------------------------------------------------------------------------------------------------------------------------------------------------------------------------------------- Physical Exam BP 130/76 (BP Location: Right Arm, Patient Position: Sitting, Cuff Size: Large)   Pulse 63   Ht '6\' 4"'$  (1.93 m)   Wt 244 lb (110.7 kg)   SpO2 100%   BMI 29.70  kg/m   Physical Exam Constitutional:      General: He is not in acute distress. HENT:     Head: Normocephalic and atraumatic.     Right Ear: Tympanic membrane and external ear normal.     Left Ear: Tympanic membrane and external ear normal.  Eyes:     General: No scleral icterus. Neck:     Thyroid: No thyromegaly.  Cardiovascular:     Rate and Rhythm: Normal rate and regular rhythm.     Heart sounds: Normal heart sounds.  Pulmonary:     Effort: Pulmonary effort is normal.     Breath sounds: Normal breath sounds.  Abdominal:     General: Bowel sounds are normal. There is no distension.     Palpations: Abdomen is soft.     Tenderness: There is no abdominal tenderness. There is no guarding.  Musculoskeletal:     Cervical back: Normal range of motion.  Lymphadenopathy:     Cervical: No cervical adenopathy.  Skin:    General: Skin is warm and dry.     Findings: No rash.  Neurological:     Mental Status: He is alert and oriented to person, place, and time.     Cranial Nerves: No cranial nerve deficit.     Motor: No abnormal muscle tone.  Psychiatric:        Mood and Affect: Mood normal.        Behavior: Behavior normal.     ------------------------------------------------------------------------------------------------------------------------------------------------------------------------------------------------------------------- Assessment and Plan  Well adult exam Well adult Orders Placed This Encounter  Procedures   Flu Vaccine QUAD 6+ mos PF IM (Fluarix Quad PF)   Zoster Recombinant (Shingrix )   COMPLETE METABOLIC PANEL WITH GFR   CBC with Differential   Lipid Panel w/reflex Direct LDL   TSH   PSA  Screenings: Per lab orders Immunizations: Shingrix and flu given today. Anticipatory guidance/risk factor reduction: Recommendations per AVS.   No orders of the defined types were placed in this encounter.   No follow-ups on file.    This visit occurred  during the SARS-CoV-2 public health emergency.  Safety protocols were in place, including screening questions prior to the visit, additional usage of staff PPE, and extensive cleaning of exam room while observing appropriate contact time as indicated for disinfecting solutions.

## 2022-10-15 LAB — CBC WITH DIFFERENTIAL/PLATELET
Absolute Monocytes: 660 cells/uL (ref 200–950)
Basophils Absolute: 57 cells/uL (ref 0–200)
Basophils Relative: 0.8 %
Eosinophils Absolute: 263 cells/uL (ref 15–500)
Eosinophils Relative: 3.7 %
HCT: 37 % — ABNORMAL LOW (ref 38.5–50.0)
Hemoglobin: 13 g/dL — ABNORMAL LOW (ref 13.2–17.1)
Lymphs Abs: 2002 cells/uL (ref 850–3900)
MCH: 32 pg (ref 27.0–33.0)
MCHC: 35.1 g/dL (ref 32.0–36.0)
MCV: 91.1 fL (ref 80.0–100.0)
MPV: 9.1 fL (ref 7.5–12.5)
Monocytes Relative: 9.3 %
Neutro Abs: 4118 cells/uL (ref 1500–7800)
Neutrophils Relative %: 58 %
Platelets: 215 10*3/uL (ref 140–400)
RBC: 4.06 10*6/uL — ABNORMAL LOW (ref 4.20–5.80)
RDW: 12.7 % (ref 11.0–15.0)
Total Lymphocyte: 28.2 %
WBC: 7.1 10*3/uL (ref 3.8–10.8)

## 2022-10-15 LAB — COMPLETE METABOLIC PANEL WITH GFR
AG Ratio: 1.8 (calc) (ref 1.0–2.5)
ALT: 12 U/L (ref 9–46)
AST: 19 U/L (ref 10–35)
Albumin: 4.4 g/dL (ref 3.6–5.1)
Alkaline phosphatase (APISO): 67 U/L (ref 35–144)
BUN: 16 mg/dL (ref 7–25)
CO2: 30 mmol/L (ref 20–32)
Calcium: 9.1 mg/dL (ref 8.6–10.3)
Chloride: 97 mmol/L — ABNORMAL LOW (ref 98–110)
Creat: 1.14 mg/dL (ref 0.70–1.30)
Globulin: 2.4 g/dL (calc) (ref 1.9–3.7)
Glucose, Bld: 93 mg/dL (ref 65–139)
Potassium: 4.3 mmol/L (ref 3.5–5.3)
Sodium: 133 mmol/L — ABNORMAL LOW (ref 135–146)
Total Bilirubin: 0.7 mg/dL (ref 0.2–1.2)
Total Protein: 6.8 g/dL (ref 6.1–8.1)
eGFR: 77 mL/min/{1.73_m2} (ref >=60)

## 2022-10-15 LAB — LIPID PANEL W/REFLEX DIRECT LDL
Cholesterol: 138 mg/dL (ref <200)
HDL: 59 mg/dL (ref >=40)
LDL Cholesterol (Calc): 56 mg/dL (calc)
Non-HDL Cholesterol (Calc): 79 mg/dL (calc) (ref <130)
Total CHOL/HDL Ratio: 2.3 (calc) (ref <5.0)
Triglycerides: 157 mg/dL — ABNORMAL HIGH (ref <150)

## 2022-10-15 LAB — TSH: TSH: 2.14 mIU/L (ref 0.40–4.50)

## 2022-10-15 LAB — PSA: PSA: 1.06 ng/mL (ref <=4.00)

## 2023-01-09 ENCOUNTER — Other Ambulatory Visit: Payer: Self-pay

## 2023-01-09 MED ORDER — OLMESARTAN MEDOXOMIL 20 MG PO TABS: ORAL_TABLET | ORAL | 0 refills | Status: DC

## 2023-03-23 ENCOUNTER — Encounter: Payer: Self-pay | Admitting: *Deleted

## 2023-05-05 ENCOUNTER — Other Ambulatory Visit: Payer: Self-pay

## 2023-05-05 DIAGNOSIS — I10 Essential (primary) hypertension: Secondary | ICD-10-CM

## 2023-05-05 MED ORDER — OLMESARTAN MEDOXOMIL 20 MG PO TABS: ORAL_TABLET | ORAL | 0 refills | Status: DC

## 2023-08-28 ENCOUNTER — Other Ambulatory Visit: Payer: Self-pay

## 2023-08-28 DIAGNOSIS — I10 Essential (primary) hypertension: Secondary | ICD-10-CM

## 2023-08-28 MED ORDER — OLMESARTAN MEDOXOMIL 20 MG PO TABS: ORAL_TABLET | ORAL | 0 refills | Status: DC

## 2023-08-28 NOTE — Telephone Encounter (Signed)
Called patient to schedule an annual exam for November left vm to call back and schedule

## 2023-09-21 ENCOUNTER — Encounter: Payer: Self-pay | Admitting: Family Medicine

## 2023-09-21 MED ORDER — TADALAFIL 20 MG PO TABS: 10.0000 mg | ORAL_TABLET | ORAL | 0 refills | Status: AC | PRN

## 2023-10-16 ENCOUNTER — Encounter: Payer: Self-pay | Admitting: Family Medicine

## 2023-10-16 ENCOUNTER — Ambulatory Visit (INDEPENDENT_AMBULATORY_CARE_PROVIDER_SITE_OTHER): Payer: 59 | Admitting: Family Medicine

## 2023-10-16 VITALS — BP 109/74 | HR 69 | Resp 20 | Ht 76.0 in | Wt 222.0 lb

## 2023-10-16 DIAGNOSIS — Z125 Encounter for screening for malignant neoplasm of prostate: Secondary | ICD-10-CM | POA: Diagnosis not present

## 2023-10-16 DIAGNOSIS — I10 Essential (primary) hypertension: Secondary | ICD-10-CM | POA: Diagnosis not present

## 2023-10-16 DIAGNOSIS — Z13220 Encounter for screening for lipoid disorders: Secondary | ICD-10-CM

## 2023-10-16 DIAGNOSIS — Z23 Encounter for immunization: Secondary | ICD-10-CM | POA: Diagnosis not present

## 2023-10-16 DIAGNOSIS — Z0000 Encounter for general adult medical examination without abnormal findings: Secondary | ICD-10-CM | POA: Diagnosis not present

## 2023-10-16 MED ORDER — OLMESARTAN MEDOXOMIL 20 MG PO TABS: ORAL_TABLET | ORAL | 3 refills | Status: DC

## 2023-10-16 NOTE — Assessment & Plan Note (Signed)
BP remains well controlled with current dose of benicar, continue.

## 2023-10-16 NOTE — Patient Instructions (Signed)

## 2023-10-16 NOTE — Assessment & Plan Note (Signed)
Well adult Orders Placed This Encounter  Procedures   CMP14+EGFR   CBC with Differential/Platelet   Lipid Panel With LDL/HDL Ratio   PSA  Screenings: Per lab orders Immunizations: Tdap and flu given today. Anticipatory guidance/risk factor reduction: Recommendations per AVS.

## 2023-10-16 NOTE — Progress Notes (Signed)
David Avila - 52 y.o. male MRN [id number]  Date of birth: [birth year]  Subjective Chief Complaint  Patient presents with   Annual Exam    HPI David Avila is a 52 y.o. male here today for annual exam.   Reports that he is doing well.   BP remains well controlled with olmesartan. No side effects.    He is moderately active.  He feels that diet is pretty good.   Non-smoker.  Occasional EtOH use.   Review of Systems  Constitutional:  Negative for chills, fever, malaise/fatigue and weight loss.  HENT:  Negative for congestion, ear pain and sore throat.   Eyes:  Negative for blurred vision, double vision and pain.  Respiratory:  Negative for cough and shortness of breath.   Cardiovascular:  Negative for chest pain and palpitations.  Gastrointestinal:  Negative for abdominal pain, blood in stool, constipation, heartburn and nausea.  Genitourinary:  Negative for dysuria and urgency.  Musculoskeletal:  Negative for joint pain and myalgias.  Neurological:  Negative for dizziness and headaches.  Endo/Heme/Allergies:  Does not bruise/bleed easily.  Psychiatric/Behavioral:  Negative for depression. The patient is not nervous/anxious and does not have insomnia.     No Known Allergies  Past Medical History:  Diagnosis Date   Cancer ([facility])    melanoma -insitu-left foot   Hypertension     Past Surgical History:  Procedure Laterality Date   HERNIA REPAIR  1998    Social History   Socioeconomic History   Marital status: Married    Spouse name: Not on file   Number of children: Not on file   Years of education: Not on file   Highest education level: Master's degree (e.g., MA, MS, MEng, MEd, MSW, MBA)  Occupational History   Not on file  Tobacco Use   Smoking status: Never   Smokeless tobacco: Never  Vaping Use   Vaping status: Never Used  Substance and Sexual Activity   Alcohol use: Yes    Alcohol/week: 6.0 standard drinks of alcohol    Types: 6 Cans of beer per week    Drug use: Never   Sexual activity: Yes  Other Topics Concern   Not on file  Social History Narrative   Not on file   Social Determinants of Health   Financial Resource Strain: Low Risk  (10/15/2023)   Overall Financial Resource Strain (CARDIA)    Difficulty of Paying Living Expenses: Not hard at all  Food Insecurity: No Food Insecurity (10/15/2023)   Hunger Vital Sign    Worried About Running Out of Food in the Last Year: Never true    Ran Out of Food in the Last Year: Never true  Transportation Needs: No Transportation Needs (10/15/2023)   PRAPARE - Administrator, Civil Service (Medical): No    Lack of Transportation (Non-Medical): No  Physical Activity: Insufficiently Active (10/15/2023)   Exercise Vital Sign    Days of Exercise per Week: 3 days    Minutes of Exercise per Session: 30 min  Stress: No Stress Concern Present (10/15/2023)   [employer] of Occupational Health - Occupational Stress Questionnaire    Feeling of Stress : Not at all  Social Connections: Moderately Integrated (10/15/2023)   Social Connection and Isolation Panel [NHANES]    Frequency of Communication with Friends and Family: More than three times a week    Frequency of Social Gatherings with Friends and Family: Once a week    Attends Religious Services:  1 to 4 times per year    Active Member of Clubs or Organizations: No    Attends Engineer, structural: Not on file    Marital Status: Married    Family History  Problem Relation Age of Onset   COPD Mother    Diabetes Mother    Early death Mother    Hyperlipidemia Mother    Hypertension Mother    Stroke Mother    Alcohol abuse Father    Cancer Father    Early death Father    COPD Maternal Grandmother    Early death Maternal Grandmother    Early death Maternal Grandfather    Hyperlipidemia Maternal Grandfather    Cancer Paternal Grandmother    Colon cancer Paternal Grandmother    Heart disease Paternal Grandfather     Colon polyps Neg Hx    Esophageal cancer Neg Hx    Rectal cancer Neg Hx    Stomach cancer Neg Hx     Health Maintenance  Topic Date Due   HIV Screening  Never done   Hepatitis C Screening  Never done   DTaP/Tdap/Td (1 - Tdap) Never done   INFLUENZA VACCINE  07/09/2023   COVID-19 Vaccine (1 - 2023-24 season) Never done   Colonoscopy  02/23/2027   Zoster Vaccines- Shingrix  Completed   HPV VACCINES  Aged Out     ----------------------------------------------------------------------------------------------------------------------------------------------------------------------------------------------------------------- Physical Exam BP 109/74 (BP Location: Left Arm, Cuff Size: Normal)   Pulse 69   Resp 20   Ht 6\' 4"  (1.93 m)   Wt 222 lb (100.7 kg)   SpO2 100%   BMI 27.02 kg/m   Physical Exam Constitutional:      General: He is not in acute distress. HENT:     Head: Normocephalic and atraumatic.     Right Ear: Tympanic membrane and external ear normal.     Left Ear: Tympanic membrane and external ear normal.  Eyes:     General: No scleral icterus. Neck:     Thyroid: No thyromegaly.  Cardiovascular:     Rate and Rhythm: Normal rate and regular rhythm.     Heart sounds: Normal heart sounds.  Pulmonary:     Effort: Pulmonary effort is normal.     Breath sounds: Normal breath sounds.  Abdominal:     General: Bowel sounds are normal. There is no distension.     Palpations: Abdomen is soft.     Tenderness: There is no abdominal tenderness. There is no guarding.  Musculoskeletal:     Cervical back: Normal range of motion.  Lymphadenopathy:     Cervical: No cervical adenopathy.  Skin:    General: Skin is warm and dry.     Findings: No rash.  Neurological:     Mental Status: He is alert and oriented to person, place, and time.     Cranial Nerves: No cranial nerve deficit.     Motor: No abnormal muscle tone.  Psychiatric:        Mood and Affect: Mood normal.         Behavior: Behavior normal.     ------------------------------------------------------------------------------------------------------------------------------------------------------------------------------------------------------------------- Assessment and Plan  Well adult exam Well adult Orders Placed This Encounter  Procedures   CMP14+EGFR   CBC with Differential/Platelet   Lipid Panel With LDL/HDL Ratio   PSA  Screenings: Per lab orders Immunizations: Tdap and flu given today. Anticipatory guidance/risk factor reduction: Recommendations per AVS.   Essential hypertension BP remains well controlled with current dose of benicar,  continue.   Meds ordered this encounter  Medications   olmesartan (BENICAR) 20 MG tablet    Sig: TAKE ONE TABLET BY MOUTH DAILY    Dispense:  90 tablet    Refill:  3    Return in about 1 year (around 10/15/2024) for Annual Exam, Hypertension.    This visit occurred during the SARS-CoV-2 public health emergency.  Safety protocols were in place, including screening questions prior to the visit, additional usage of staff PPE, and extensive cleaning of exam room while observing appropriate contact time as indicated for disinfecting solutions.

## 2023-10-17 LAB — LIPID PANEL WITH LDL/HDL RATIO
Cholesterol, Total: 180 mg/dL (ref 100–199)
HDL: 71 mg/dL (ref >39)
LDL Chol Calc (NIH): 93 mg/dL (ref 0–99)
LDL/HDL Ratio: 1.3 ratio (ref 0.0–3.6)
Triglycerides: 88 mg/dL (ref 0–149)
VLDL Cholesterol Cal: 16 mg/dL (ref 5–40)

## 2023-10-17 LAB — CBC WITH DIFFERENTIAL/PLATELET
Basophils Absolute: 0 10*3/uL (ref 0.0–0.2)
Basos: 1 %
EOS (ABSOLUTE): 0.2 10*3/uL (ref 0.0–0.4)
Eos: 4 %
Hematocrit: 41.5 % (ref 37.5–51.0)
Hemoglobin: 14.6 g/dL (ref 13.0–17.7)
Immature Grans (Abs): 0 10*3/uL (ref 0.0–0.1)
Immature Granulocytes: 0 %
Lymphocytes Absolute: 1.9 10*3/uL (ref 0.7–3.1)
Lymphs: 37 %
MCH: 32.2 pg (ref 26.6–33.0)
MCHC: 35.2 g/dL (ref 31.5–35.7)
MCV: 92 fL (ref 79–97)
Monocytes Absolute: 0.7 10*3/uL (ref 0.1–0.9)
Monocytes: 14 %
Neutrophils Absolute: 2.3 10*3/uL (ref 1.4–7.0)
Neutrophils: 44 %
Platelets: 248 10*3/uL (ref 150–450)
RBC: 4.53 x10E6/uL (ref 4.14–5.80)
RDW: 13.2 % (ref 11.6–15.4)
WBC: 5.2 10*3/uL (ref 3.4–10.8)

## 2023-10-17 LAB — CMP14+EGFR
ALT: 16 [IU]/L (ref 0–44)
AST: 19 [IU]/L (ref 0–40)
Albumin: 4.9 g/dL (ref 3.8–4.9)
Alkaline Phosphatase: 66 [IU]/L (ref 44–121)
BUN/Creatinine Ratio: 7 — ABNORMAL LOW (ref 9–20)
BUN: 8 mg/dL (ref 6–24)
Bilirubin Total: 0.5 mg/dL (ref 0.0–1.2)
CO2: 22 mmol/L (ref 20–29)
Calcium: 9.8 mg/dL (ref 8.7–10.2)
Chloride: 97 mmol/L (ref 96–106)
Creatinine, Ser: 1.23 mg/dL (ref 0.76–1.27)
Globulin, Total: 2.5 g/dL (ref 1.5–4.5)
Glucose: 85 mg/dL (ref 70–99)
Potassium: 4.9 mmol/L (ref 3.5–5.2)
Sodium: 133 mmol/L — ABNORMAL LOW (ref 134–144)
Total Protein: 7.4 g/dL (ref 6.0–8.5)
eGFR: 70 mL/min/{1.73_m2} (ref >59)

## 2023-10-17 LAB — PSA: Prostate Specific Ag, Serum: 2.1 ng/mL (ref 0.0–4.0)

## 2024-05-04 ENCOUNTER — Encounter: Payer: Self-pay | Admitting: Family Medicine

## 2024-05-24 ENCOUNTER — Ambulatory Visit: Admitting: Family Medicine

## 2024-05-24 VITALS — BP 127/79 | HR 68 | Ht 76.0 in | Wt 241.0 lb

## 2024-05-24 DIAGNOSIS — Z0184 Encounter for antibody response examination: Secondary | ICD-10-CM

## 2024-05-24 DIAGNOSIS — Z021 Encounter for pre-employment examination: Secondary | ICD-10-CM | POA: Diagnosis not present

## 2024-05-24 DIAGNOSIS — E871 Hypo-osmolality and hyponatremia: Secondary | ICD-10-CM

## 2024-05-24 NOTE — Assessment & Plan Note (Signed)
 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  Checking labs for titers as well as TB screening.  Orders Placed This Encounter  Procedures   Hepatitis B Surface AntiBODY   Measles/Mumps/Rubella Immunity   Varicella zoster antibody, IgG   QuantiFERON-TB Gold Plus   Basic Metabolic Panel (BMET)

## 2024-05-24 NOTE — Progress Notes (Signed)
 David Avila - 53 y.o. male MRN [id number]  Date of birth: 29-Apr-1971  Subjective Chief Complaint  Patient presents with   Employment Physical    HPI David Avila is a 53 y.o. male here today to have preemployment physical completed.  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  Overall he has been in good health.  He has history of hypertension and melanoma.  Melanoma was in situ and completely removed with surgical excision previously.  His blood pressure is well-controlled with current medication.  He denies any chest pain, respiratory difficulties or significant orthopedic issues at this time.  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  Review of Systems  Constitutional:  Negative for chills, fever, malaise/fatigue and weight loss.  HENT:  Negative for congestion, ear pain and sore throat.   Eyes:  Negative for blurred vision, double vision and pain.  Respiratory:  Negative for cough and shortness of breath.   Cardiovascular:  Negative for chest pain and palpitations.  Gastrointestinal:  Negative for abdominal pain, blood in stool, constipation, heartburn and nausea.  Genitourinary:  Negative for dysuria and urgency.  Musculoskeletal:  Negative for joint pain and myalgias.  Neurological:  Negative for dizziness and headaches.  Endo/Heme/Allergies:  Does not bruise/bleed easily.  Psychiatric/Behavioral:  Negative for depression. The patient is not nervous/anxious and does not have insomnia.     Allergies  Allergen Reactions   Albuterol Rash    Past Medical History:  Diagnosis Date   Cancer ([facility])    melanoma -insitu-left foot   Hypertension     Past Surgical History:  Procedure Laterality Date   HERNIA REPAIR  1998    Social History   Socioeconomic History   Marital status: Married    Spouse name: Not on file   Number of children: Not on file   Years of  education: Not on file   Highest education level: Master's degree (e.g., MA, MS, MEng, MEd, MSW, MBA)  Occupational History   Not on file  Tobacco Use   Smoking status: Never   Smokeless tobacco: Never  Vaping Use   Vaping status: Never Used  Substance and Sexual Activity   Alcohol use: Yes    Alcohol/week: 6.0 standard drinks of alcohol    Types: 6 Cans of beer per week   Drug use: Never   Sexual activity: Yes  Other Topics Concern   Not on file  Social History Narrative   Not on file   Social Drivers of Health   Financial Resource Strain: Low Risk  (05/24/2024)   Overall Financial Resource Strain (CARDIA)    Difficulty of Paying Living Expenses: Not hard at all  Food Insecurity: No Food Insecurity (05/24/2024)   Hunger Vital Sign    Worried About Running Out of Food in the Last Year: Never true    Ran Out of Food in the Last Year: Never true  Transportation Needs: No Transportation Needs (05/24/2024)   PRAPARE - Administrator, Civil Service (Medical): No    Lack of Transportation (Non-Medical): No  Physical Activity: Sufficiently Active (05/24/2024)   Exercise Vital Sign    Days of Exercise per Week: 3 days    Minutes of Exercise per Session: 60 min  Stress: No Stress Concern Present (05/24/2024)   [employer] of Occupational Health - Occupational Stress Questionnaire    Feeling of Stress: Not  at all  Social Connections: Unknown (05/24/2024)   Social Connection and Isolation Panel    Frequency of Communication with Friends and Family: More than three times a week    Frequency of Social Gatherings with Friends and Family: Once a week    Attends Religious Services: Patient declined    Database administrator or Organizations: Patient declined    Attends Engineer, structural: Not on file    Marital Status: Married    Family History  Problem Relation Age of Onset   COPD Mother    Diabetes Mother    Early death Mother    Hyperlipidemia Mother     Hypertension Mother    Stroke Mother    Alcohol abuse Father    Cancer Father    Early death Father    COPD Maternal Grandmother    Early death Maternal Grandmother    Early death Maternal Grandfather    Hyperlipidemia Maternal Grandfather    Cancer Paternal Grandmother    Colon cancer Paternal Grandmother    Heart disease Paternal Grandfather    Colon polyps Neg Hx    Esophageal cancer Neg Hx    Rectal cancer Neg Hx    Stomach cancer Neg Hx     Health Maintenance  Topic Date Due   HIV Screening  Never done   Hepatitis C Screening  Never done   COVID-19 Vaccine (4 - 2024-25 season) 08/09/2023   INFLUENZA VACCINE  07/08/2024   Colonoscopy  02/23/2027   DTaP/Tdap/Td (2 - Td or Tdap) 10/15/2033   Zoster Vaccines- Shingrix   Completed   Pneumococcal Vaccine 38-44 Years old  Aged Out   HPV VACCINES  Aged Out   Meningococcal B Vaccine  Aged Out     ----------------------------------------------------------------------------------------------------------------------------------------------------------------------------------------------------------------- Physical Exam BP 127/79 (BP Location: Left Arm, Patient Position: Sitting, Cuff Size: Large)   Pulse 68   Ht 6' 4 (1.93 m)   Wt 241 lb (109.3 kg)   SpO2 100%   BMI 29.34 kg/m   Physical Exam Constitutional:      General: He is not in acute distress. HENT:     Head: Normocephalic and atraumatic.     Right Ear: Tympanic membrane and external ear normal.     Left Ear: Tympanic membrane and external ear normal.   Eyes:     General: No scleral icterus.  Neck:     Thyroid : No thyromegaly.   Cardiovascular:     Rate and Rhythm: Normal rate and regular rhythm.     Heart sounds: Normal heart sounds.  Pulmonary:     Effort: Pulmonary effort is normal.     Breath sounds: Normal breath sounds.  Abdominal:     General: Bowel sounds are normal. There is no distension.     Palpations: Abdomen is soft.     Tenderness:  There is no abdominal tenderness. There is no guarding.   Musculoskeletal:     Cervical back: Normal range of motion.  Lymphadenopathy:     Cervical: No cervical adenopathy.   Skin:    General: Skin is warm and dry.     Findings: No rash.   Neurological:     Mental Status: He is alert and oriented to person, place, and time.     Cranial Nerves: No cranial nerve deficit.     Motor: No abnormal muscle tone.   Psychiatric:        Mood and Affect: Mood normal.        Behavior:  Behavior normal.     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  Checking labs for titers as well as TB screening.  Orders Placed This Encounter  Procedures   Hepatitis B Surface AntiBODY   Measles/Mumps/Rubella Immunity   Varicella zoster antibody, IgG   QuantiFERON-TB Gold Plus   Basic Metabolic Panel (BMET)     No orders of the defined types were placed in this encounter.   No follow-ups on file.

## 2024-05-25 LAB — BASIC METABOLIC PANEL WITH GFR
BUN/Creatinine Ratio: 11 (ref 9–20)
BUN: 13 mg/dL (ref 6–24)
Calcium: 9.7 mg/dL (ref 8.7–10.2)
Chloride: 97 mmol/L (ref 96–106)
Creatinine, Ser: 1.14 mg/dL (ref 0.76–1.27)
Glucose: 91 mg/dL (ref 70–99)

## 2024-05-25 LAB — MEASLES/MUMPS/RUBELLA IMMUNITY: RUBEOLA AB, IGG: 218 [AU]/ml (ref >16.4)

## 2024-05-29 LAB — QUANTIFERON-TB GOLD PLUS
QuantiFERON Nil Value: 0.15 [IU]/mL
QuantiFERON TB1 Ag Value: 0.12 [IU]/mL
QuantiFERON TB2 Ag Value: 0.12 [IU]/mL

## 2024-05-29 LAB — BASIC METABOLIC PANEL WITH GFR
CO2: 21 mmol/L (ref 20–29)
Potassium: 4.9 mmol/L (ref 3.5–5.2)
Sodium: 135 mmol/L (ref 134–144)
eGFR: 76 mL/min/{1.73_m2} (ref >59)

## 2024-05-29 LAB — VARICELLA ZOSTER ANTIBODY, IGG: Varicella zoster IgG: REACTIVE

## 2024-05-29 LAB — MEASLES/MUMPS/RUBELLA IMMUNITY
MUMPS ABS, IGG: 103 [AU]/ml (ref >10.9)
Rubella Antibodies, IGG: >33 {index} (ref >0.99)

## 2024-05-29 LAB — HEPATITIS B SURFACE ANTIBODY,QUALITATIVE: Hep B Surface Ab, Qual: REACTIVE

## 2024-06-12 ENCOUNTER — Ambulatory Visit: Payer: Self-pay | Admitting: Family Medicine

## 2024-11-23 ENCOUNTER — Encounter: Payer: Self-pay | Admitting: Family Medicine

## 2024-11-23 DIAGNOSIS — I10 Essential (primary) hypertension: Secondary | ICD-10-CM

## 2024-11-23 MED ORDER — OLMESARTAN MEDOXOMIL 20 MG PO TABS: ORAL_TABLET | ORAL | 3 refills | Status: DC

## 2024-11-23 NOTE — Telephone Encounter (Signed)
 Requesting rx rf of Olmesartan  20mg   Last written 10/16/2023 Last OV 06/17/225 pre- employment physical  Upcoming appt = none

## 2024-11-24 ENCOUNTER — Other Ambulatory Visit: Payer: Self-pay

## 2024-11-24 DIAGNOSIS — I10 Essential (primary) hypertension: Secondary | ICD-10-CM

## 2024-11-24 NOTE — Telephone Encounter (Signed)
 Last read by Marty ONEIDA Necessary at 9:51AM on 11/24/2024.
